# Patient Record
Sex: Female | Born: 1995 | Race: Black or African American | Hispanic: No | Marital: Single | Smoking: Never smoker
Health system: Southern US, Community
[De-identification: ages and names within clinical notes are randomized; demographics above are authoritative.]

## PROBLEM LIST (undated history)

## (undated) ENCOUNTER — Ambulatory Visit (HOSPITAL_COMMUNITY): Admission: EM | Payer: Medicaid Other

## (undated) ENCOUNTER — Emergency Department (HOSPITAL_COMMUNITY): Admission: EM | Payer: Medicaid Other | Source: Home / Self Care

## (undated) DIAGNOSIS — J45909 Unspecified asthma, uncomplicated: Secondary | ICD-10-CM

## (undated) DIAGNOSIS — F32A Depression, unspecified: Secondary | ICD-10-CM

## (undated) DIAGNOSIS — F419 Anxiety disorder, unspecified: Secondary | ICD-10-CM

## (undated) HISTORY — PX: NO PAST SURGERIES: SHX2092

---
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
## 2017-01-10 ENCOUNTER — Emergency Department (HOSPITAL_COMMUNITY)
Admission: EM | Admit: 2017-01-10 | Discharge: 2017-01-10 | Disposition: A | Discharge status: Home / Self Care | Payer: Medicaid Other | Attending: Emergency Medicine | Admitting: Emergency Medicine

## 2017-01-10 ENCOUNTER — Encounter (HOSPITAL_COMMUNITY): Payer: Self-pay | Admitting: *Deleted

## 2017-01-10 DIAGNOSIS — Z7722 Contact with and (suspected) exposure to environmental tobacco smoke (acute) (chronic): Secondary | ICD-10-CM | POA: Insufficient documentation

## 2017-01-10 DIAGNOSIS — Z202 Contact with and (suspected) exposure to infections with a predominantly sexual mode of transmission: Secondary | ICD-10-CM

## 2017-01-10 DIAGNOSIS — N898 Other specified noninflammatory disorders of vagina: Secondary | ICD-10-CM | POA: Diagnosis present

## 2017-01-10 DIAGNOSIS — N760 Acute vaginitis: Secondary | ICD-10-CM | POA: Insufficient documentation

## 2017-01-10 DIAGNOSIS — B9689 Other specified bacterial agents as the cause of diseases classified elsewhere: Secondary | ICD-10-CM | POA: Insufficient documentation

## 2017-01-10 DIAGNOSIS — J45909 Unspecified asthma, uncomplicated: Secondary | ICD-10-CM | POA: Diagnosis not present

## 2017-01-10 LAB — WET PREP, GENITAL
Sperm: NONE SEEN
Trich, Wet Prep: NONE SEEN
Yeast Wet Prep HPF POC: NONE SEEN

## 2017-01-10 MED ORDER — METRONIDAZOLE 500 MG PO TABS: 500.0000 mg | ORAL_TABLET | Freq: Two times a day (BID) | ORAL | 0 refills | Status: DC

## 2017-01-10 MED ORDER — AZITHROMYCIN 250 MG PO TABS
1000.0000 mg | ORAL_TABLET | Freq: Once | ORAL | Status: AC
  Administered 2017-01-10: 1000 mg via ORAL
  Filled 2017-01-10: qty 4

## 2017-01-10 NOTE — ED Provider Notes (Signed)
[facility]-EMERGENCY DEPT Provider Note   CSN: [id number] Arrival date & time: 01/10/17  1439     History   Chief Complaint Chief Complaint  Patient presents with  . Vaginal Discharge  . SEXUALLY TRANSMITTED DISEASE    HPI ARMYA WESTERHOFF is a 21 y.o. female.  Pt w ~2wk hx of malodorous vaginal discharge that has improved w OTC tx of "refresh." Pt reports pelvic discomfort w low back pain. Reports she has been sexually active w 1 partner, without protection and believes she has an STD. Denies dysuria, F/C, rashes or lesions, vaginal bleeding. LMP about 2.5wks ago. Pt reports hx chlamydia and BV. Nexplanon OCP.       Past Medical History:  Diagnosis Date  . Asthma     There are no active problems to display for this patient.   History reviewed. No pertinent surgical history.  OB History    No data available       Home Medications    Prior to Admission medications   Medication Sig Start Date End Date Taking? Authorizing Provider  albuterol (PROVENTIL HFA;VENTOLIN HFA) 108 (90 BASE) MCG/ACT inhaler Inhale 2 puffs into the lungs every 6 (six) hours as needed for wheezing or shortness of breath. 11/18/14  Yes Misty Bong, MD  etonogestrel (NEXPLANON) 68 MG IMPL implant 1 each by Subdermal route once. Implanted in left arm July 2015   Yes Historical Provider, MD  naproxen sodium (ALEVE) 220 MG tablet Take 440 mg by mouth daily as needed (pain/headaches/cramps).   Yes Historical Provider, MD  metroNIDAZOLE (FLAGYL) 500 MG tablet Take 1 tablet (500 mg total) by mouth 2 (two) times daily. 01/10/17   Misty N Russo, PA-C    Family History History reviewed. No pertinent family history.  Social History Social History  Substance Use Topics  . Smoking status: Passive Smoke Exposure - Never Smoker  . Smokeless tobacco: Not on file  . Alcohol use No     Allergies   Penicillins   Review of Systems Review of Systems  Constitutional: Negative for chills and fever.    Gastrointestinal: Negative for abdominal pain, nausea and vomiting.  Genitourinary: Positive for pelvic pain (b/l discomfort) and vaginal discharge. Negative for dyspareunia, dysuria, hematuria, vaginal bleeding and vaginal pain.  Musculoskeletal: Positive for back pain (low back).  Skin: Negative for rash (or lesions).     Physical Exam Updated Vital Signs BP 105/62 (BP Location: Right Arm)   Pulse 71   Temp 98.4 F (36.9 C) (Oral)   Resp 20   SpO2 100%   Physical Exam  Constitutional: She appears well-developed and well-nourished.  HENT:  Head: Normocephalic and atraumatic.  Eyes: Conjunctivae are normal.  Cardiovascular: Normal rate, regular rhythm, normal heart sounds and intact distal pulses.  Exam reveals no friction rub.   No murmur heard. Pulmonary/Chest: Effort normal and breath sounds normal. No respiratory distress. She has no wheezes. She has no rales.  Abdominal: Soft. Bowel sounds are normal. She exhibits no distension. There is no tenderness. There is no guarding.  Genitourinary: There is no tenderness or lesion on the right labia. There is no tenderness or lesion on the left labia. Uterus is not tender. Cervix exhibits discharge and friability. Cervix exhibits no motion tenderness. Right adnexum displays tenderness. Right adnexum displays no fullness. Left adnexum displays tenderness. Left adnexum displays no fullness. Vaginal discharge (copious yellow, frothy, malodorous discharge) found.  Genitourinary Comments: Pelvic exam performed w chaperone present  Skin: No rash noted.  Psychiatric: She has a normal mood and affect. Her behavior is normal.  Nursing note and vitals reviewed.    ED Treatments / Results  Labs (all labs ordered are listed, but only abnormal results are displayed) Labs Reviewed  WET PREP, GENITAL - Abnormal; Notable for the following:       Result Value   Clue Cells Wet Prep HPF POC PRESENT (*)    WBC, Wet Prep HPF POC MANY (*)    All  other components within normal limits  RPR  HIV ANTIBODY (ROUTINE TESTING)  GC/CHLAMYDIA PROBE AMP (Antoine) NOT AT [facility]    EKG  EKG Interpretation None       Radiology No results found.  Procedures Procedures (including critical care time)  Medications Ordered in ED Medications  azithromycin (ZITHROMAX) tablet 1,000 mg (1,000 mg Oral Given 01/10/17 1755)     Initial Impression / Assessment and Plan / ED Course  I have reviewed the triage vital signs and the nursing notes.  Pertinent labs & imaging results that were available during my care of the patient were reviewed by me and considered in my medical decision making (see chart for details).    Pt w BV and STD exposure. Patient to be discharged with instructions to follow up with OBGYN. Discussed importance of using protection when sexually active. Pt understands that they have GC/Chlamydia cultures pending and that they will need to inform all sexual partners if results return positive. Pt has been treated prophylactically with azithromycin due to pts history, pelvic exam, and wet prep with increased WBCs. Pt allergic to PCN so Rocephin not given. Explained need for additional abx if GC/Chlamydia is results positive. Pt not concerning for PID because hemodynamically stable and no cervical motion tenderness on pelvic exam. Pt has also been treated with flagyl for Bacterial Vaginosis. Pt has been advised to not drink alcohol while on this medication. Pt afebrile, not in distress, safe for discharge.   Discussed results, findings, treatment and follow up. Patient advised of return precautions. Patient verbalized understanding and agreed with plan.   Final Clinical Impressions(s) / ED Diagnoses   Final diagnoses:  BV (bacterial vaginosis)  Exposure to STD    New Prescriptions Discharge Medication List as of 01/10/2017  5:41 PM    START taking these medications   Details  metroNIDAZOLE (FLAGYL) 500 MG tablet Take 1  tablet (500 mg total) by mouth 2 (two) times daily., Starting Sat 01/10/2017, Print         Misty N Russo, PA-C 01/10/17 1858    Misty Barrette, MD 01/15/17 2201

## 2017-01-10 NOTE — ED Notes (Signed)
Pt testing on cell  Never looked up from the phone as I as ked her questions  Child approx 10 at the bedside

## 2017-01-10 NOTE — ED Triage Notes (Signed)
Pt reports having vaginal discharge and odor, thinks she has STD.

## 2017-01-10 NOTE — Discharge Instructions (Signed)
Please read instructions below. Talk with your primary care provider about any new medications. Please follow up with your primary care or OBGYN. Finish your antibiotic (Flagyl/metronidazole) as prescribed. You will receive a call from the hospital if your test results come back positive. Avoid sexual activity until you know your test results. If your results come back positive, it is important that you inform all of your sexual partners. Return to ER for fever or worsening symptoms.

## 2017-01-11 LAB — HIV ANTIBODY (ROUTINE TESTING W REFLEX): HIV Screen 4th Generation wRfx: NONREACTIVE

## 2017-01-11 LAB — RPR: RPR Ser Ql: NONREACTIVE

## 2017-01-12 LAB — GC/CHLAMYDIA PROBE AMP (~~LOC~~) NOT AT ARMC
Chlamydia: POSITIVE — AB
NEISSERIA GONORRHEA: NEGATIVE

## 2017-03-13 ENCOUNTER — Emergency Department
Admission: EM | Admit: 2017-03-13 | Discharge: 2017-03-13 | Disposition: A | Discharge status: Home / Self Care | Payer: No Typology Code available for payment source | Attending: Emergency Medicine | Admitting: Emergency Medicine

## 2017-03-13 ENCOUNTER — Emergency Department: Payer: No Typology Code available for payment source

## 2017-03-13 ENCOUNTER — Encounter: Payer: Self-pay | Admitting: Emergency Medicine

## 2017-03-13 DIAGNOSIS — Y92410 Unspecified street and highway as the place of occurrence of the external cause: Secondary | ICD-10-CM | POA: Insufficient documentation

## 2017-03-13 DIAGNOSIS — J45909 Unspecified asthma, uncomplicated: Secondary | ICD-10-CM | POA: Insufficient documentation

## 2017-03-13 DIAGNOSIS — S8991XA Unspecified injury of right lower leg, initial encounter: Secondary | ICD-10-CM | POA: Diagnosis present

## 2017-03-13 DIAGNOSIS — S50811A Abrasion of right forearm, initial encounter: Secondary | ICD-10-CM | POA: Insufficient documentation

## 2017-03-13 DIAGNOSIS — S90819A Abrasion, unspecified foot, initial encounter: Secondary | ICD-10-CM | POA: Diagnosis not present

## 2017-03-13 DIAGNOSIS — S8011XA Contusion of right lower leg, initial encounter: Secondary | ICD-10-CM | POA: Insufficient documentation

## 2017-03-13 DIAGNOSIS — Z79899 Other long term (current) drug therapy: Secondary | ICD-10-CM | POA: Insufficient documentation

## 2017-03-13 DIAGNOSIS — T07XXXA Unspecified multiple injuries, initial encounter: Secondary | ICD-10-CM

## 2017-03-13 DIAGNOSIS — S5011XA Contusion of right forearm, initial encounter: Secondary | ICD-10-CM | POA: Insufficient documentation

## 2017-03-13 DIAGNOSIS — Z7722 Contact with and (suspected) exposure to environmental tobacco smoke (acute) (chronic): Secondary | ICD-10-CM | POA: Insufficient documentation

## 2017-03-13 DIAGNOSIS — S5001XA Contusion of right elbow, initial encounter: Secondary | ICD-10-CM | POA: Insufficient documentation

## 2017-03-13 DIAGNOSIS — Y999 Unspecified external cause status: Secondary | ICD-10-CM | POA: Insufficient documentation

## 2017-03-13 DIAGNOSIS — Y939 Activity, unspecified: Secondary | ICD-10-CM | POA: Insufficient documentation

## 2017-03-13 MED ORDER — IBUPROFEN 600 MG PO TABS: 600.0000 mg | ORAL_TABLET | Freq: Three times a day (TID) | ORAL | 0 refills | Status: DC | PRN
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
## 2017-03-13 NOTE — Discharge Instructions (Signed)
Cleaning area with mild soap and water daily. Watch for any signs of infection. Take ibuprofen as needed for pain. You may also use ice packs to your elbow and leg as needed for pain or swelling. Follow-up with your primary care provider if any continued problems.
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
## 2017-07-06 ENCOUNTER — Ambulatory Visit: Payer: Self-pay | Admitting: Obstetrics & Gynecology

## 2017-07-17 ENCOUNTER — Ambulatory Visit (INDEPENDENT_AMBULATORY_CARE_PROVIDER_SITE_OTHER): Payer: Medicaid Other | Admitting: Obstetrics and Gynecology

## 2017-07-17 ENCOUNTER — Encounter: Payer: Self-pay | Admitting: Obstetrics and Gynecology

## 2017-07-17 DIAGNOSIS — Z3046 Encounter for surveillance of implantable subdermal contraceptive: Secondary | ICD-10-CM | POA: Insufficient documentation

## 2017-07-17 DIAGNOSIS — Z3049 Encounter for surveillance of other contraceptives: Secondary | ICD-10-CM

## 2017-07-17 NOTE — Patient Instructions (Signed)
Contraceptive Implant Information A contraceptive implant is a small, plastic rod that is inserted under the skin. The implant releases a hormone into the bloodstream that prevents pregnancy. Contraceptive implants can be effective for up to 3 years. They do not provide protection against STIs (sexually transmitted infections). How does the implant work? Contraceptive implants prevent pregnancy by releasing a small amount of progestin into the bloodstream. Progestin has similar effects to the hormone progesterone, which plays a role in menstrual periods and pregnancy. Progestin will:  Stop the ovaries from releasing eggs.  Thicken cervical mucus to prevent sperm from entering the cervix.  Thin out the lining of the uterus to prevent a fertilized egg from attaching to the wall of the uterus.  What are the advantages of this form of birth control? The advantages of this form of birth control include the following:  It is very effective at preventing pregnancy.  It is effective for up to 3 years.  It can easily be removed.  It does not interfere with sex or daily activities.  It can be used when breastfeeding.  It can be used by women who cannot take estrogen.  The procedure to insert the device is quick.  Women can get pregnant shortly after removing the device.  What are the disadvantages of this form of birth control? The disadvantages of this form of birth control include the following:  It can cause side effects, including: ? Irregular menstrual periods or bleeding. ? Headache. ? Weight gain. ? Acne. ? Breast tenderness. ? Abdomen (abdominal) pain. ? Mood changes, such as depression.  It does not protect against STIs.  You must make an office visit to have it inserted and removed by a trained clinician.  Inserting or removing the device can result in pain, scarring, and tissue or nerve damage (rare).  How is this implant inserted? The procedure to insert an implant  only takes a few minutes. During the procedure:  Your upper arm will be numbed with a numbing medicine (local anesthetic).  The implant will be injected under the skin of your upper arm with a needle.  After the procedure:  You may experience minor bruising, swelling, or discomfort at the insertion site. This should only last for a couple of days.  You may need to use another, non-hormonal contraceptive such as a condom for 7 days after the procedure.  How is the implant removed? The implant should be removed after 3 years or as directed by your health care provider. The procedure to remove the implant only takes a few minutes. During this procedure:  Your upper arm will be numbed with a local anesthetic.  A small incision will be made near the implant.  The implant will be removed with a small pair of forceps.  After the implant is removed:  The effect of the implant will wear off a few hours after removal. Most women will be able to get pregnant within 3 weeks of removal.  A new implant can be inserted as soon as the old one is removed, if desired.  You may experience minor bruising, swelling, or discomfort at the removal site. This should only last for a couple of days.  Is this implant right for me? Your health care provider can help you determine whether you are good candidate for a contraceptive implant. Make sure to discuss the possible side effects with your health care provider. You should not get the implant if you:  Are pregnant.  Are allergic   to any part of the implant.  Have a history of: ? Breast cancer. ? Unusual bleeding from the vagina. ? Heart disease. ? Stroke. ? Liver disease or tumors. ? Migraines.  Summary  A contraceptive implant is a small, plastic rod that is inserted under the skin. The implant releases a hormone into the bloodstream that prevents pregnancy.  Contraceptive implants can be effective for up to 3 years.  The implant works by  preventing ovaries from releasing eggs, thickening the cervical mucus, and thinning the uterine wall.  This form of birth control is very effective at preventing pregnancy and can be inserted and removed quickly. Women can get pregnant shortly after the device is removed.  This form of birth control can cause some side effects, including weight gain, breast tenderness, headaches, irregular periods or bleeding, acne, abdominal pain, and depression. It does not provide protection against STIs (sexually transmitted infections). This information is not intended to replace advice given to you by your health care provider. Make sure you discuss any questions you have with your health care provider. Document Released: 09/11/2011 Document Revised: 09/06/2016 Document Reviewed: 09/06/2016 Elsevier Interactive Patient Education  2017 Elsevier Inc.  

## 2017-07-17 NOTE — Progress Notes (Signed)
Misty Wood is a 21 y.o. who presents for removal of Implanon d/t to it being in place 3 years. She is planning to use condoms for contraception.    Nexplanon Removal Patient identified, informed consent performed, consent signed.   Appropriate time out taken. Nexplanon site identified.  Area prepped in usual sterile fashon. One ml of 1% lidocaine was used to anesthetize the area. Of note the implant had migrated from insertion site and was noted not to be superficial.  A small stab incision was made where the distal end of the implant was palpated. As noted above the implant was not superficial. Several attempts were made to grasp the distal end of the implant but were unsuccessful. Dr. Burnell Blanks was asked to assist. She also attempted to grasp the distal end of the implant but was unsuccessful. It was decided at that time to make a new stab incision which was felt to possible be closed to the implant. The area was prepped and anesthetized in the usual fashion. After several attempts the lower end of the implant was grasped and removed intact. There was minimal blood loss. There were no complications.   Steri-strips were applied over the small incision sites..  A pressure bandage was applied to reduce any bruising.  The patient tolerated the procedure well and was given post procedure instructions.  Patient is planning to use condoms  for contraception/attempt conception.   Hermina Staggers, MD, FACOG Attending Obstetrician & Gynecologist Center for [facility], [facility]
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
## 2018-02-24 ENCOUNTER — Encounter (HOSPITAL_COMMUNITY): Payer: Self-pay | Admitting: Family Medicine

## 2018-02-24 ENCOUNTER — Ambulatory Visit (HOSPITAL_COMMUNITY)
Admission: EM | Admit: 2018-02-24 | Discharge: 2018-02-24 | Disposition: A | Discharge status: Home / Self Care | Payer: Medicaid Other | Attending: Family Medicine | Admitting: Family Medicine

## 2018-02-24 DIAGNOSIS — J4551 Severe persistent asthma with (acute) exacerbation: Secondary | ICD-10-CM | POA: Diagnosis not present

## 2018-02-24 MED ORDER — IPRATROPIUM-ALBUTEROL 0.5-2.5 (3) MG/3ML IN SOLN
RESPIRATORY_TRACT | Status: AC
  Filled 2018-02-24: qty 3

## 2018-02-24 MED ORDER — IPRATROPIUM-ALBUTEROL 0.5-2.5 (3) MG/3ML IN SOLN
3.0000 mL | Freq: Once | RESPIRATORY_TRACT | Status: AC
  Administered 2018-02-24: 3 mL via RESPIRATORY_TRACT

## 2018-02-24 MED ORDER — PREDNISONE 10 MG (21) PO TBPK: ORAL_TABLET | Freq: Every day | ORAL | 0 refills | Status: DC

## 2018-02-24 MED ORDER — METHYLPREDNISOLONE SODIUM SUCC 125 MG IJ SOLR
125.0000 mg | Freq: Once | INTRAMUSCULAR | Status: AC
  Administered 2018-02-24: 125 mg via INTRAVENOUS

## 2018-02-24 MED ORDER — METHYLPREDNISOLONE SODIUM SUCC 125 MG IJ SOLR
INTRAMUSCULAR | Status: AC
  Filled 2018-02-24: qty 2

## 2018-02-24 NOTE — ED Triage Notes (Signed)
Pt with hx of asthma here for wheezing, coughing and SOB. She took a breathing treatment at 6 am and 12 pm. She also used her rescue inhaler without relief. She reports when she coughs that she cant breath. Pt in acute distress upon triage crying.

## 2018-02-24 NOTE — ED Provider Notes (Signed)
[facility]   [id number] 02/24/18 Arrival Time: 1439  ASSESSMENT & PLAN:  1. Severe persistent asthma with exacerbation    Nebulizer treatment needed: yes; reports significant improvement after single DuoNeb. Able to speak full sentences without problem.  Meds ordered this encounter  Medications  . ipratropium-albuterol (DUONEB) 0.5-2.5 (3) MG/3ML nebulizer solution 3 mL  . methylPREDNISolone sodium succinate (SOLU-MEDROL) 125 mg/2 mL injection 125 mg  . predniSONE (STERAPRED UNI-PAK 21 TAB) 10 MG (21) TBPK tablet    Sig: Take by mouth daily. Take as directed.    Dispense:  21 tablet    Refill:  0   Asthma precautions given. OTC symptom care as needed. May f/u with PCP or here as needed. ED if acute worsening.  Reviewed expectations re: course of current medical issues. Questions answered. Outlined signs and symptoms indicating need for more acute intervention. Patient verbalized understanding. After Visit Summary given.  SUBJECTIVE: History from: patient.  Misty Wood is a 22 y.o. female who presents with complaint of persistent chest tightness, non-productive cough and wheezing. Triggers: unknown. Onset abrupt, approximately several hours ago. Describes wheezing as moderate when present. Fever: no. Overall normal PO intake without n/v. Sick contacts: no. Typically her asthma is well controlled. Inhaler use: multiple times today in addition to home nebs; only very temporary and mild relief. OTC treatment: none. Reports feeling very SOB while in triage. No specific aggravating or alleviating factors reported.  Social History   Tobacco Use  Smoking Status Passive Smoke Exposure - Never Smoker  Smokeless Tobacco Never Used    ROS: As per HPI. All other systems negative.    OBJECTIVE:  Vitals:   02/24/18 1500  BP: 125/78  Pulse: (!) 107  Resp: (!) 28  Temp: 98.2 F (36.8 C)  SpO2: 97%    Tachycardia and tachypnea noted.  General appearance:  alert; appears fatigued HEENT: nasal congestion; clear runny nose; throat irritation secondary to post-nasal drainage Neck: supple without LAD CV: regular Lungs: unlabored respirations, moderate bilateral wheezing; cough: mild; moderate respiratory distress Abd: benign Ext: no edema Skin: warm and dry Psychological: alert and cooperative; normal mood and affect but tearful upon arrival   Allergies  Allergen Reactions  . Penicillins Hives    Has patient had a PCN reaction causing immediate rash, facial/tongue/throat swelling, SOB or lightheadedness with hypotension: Yes Has patient had a PCN reaction causing severe rash involving mucus membranes or skin necrosis: No Has patient had a PCN reaction that required hospitalization No Has patient had a PCN reaction occurring within the last 10 years: No If all of the above answers are "NO", then may proceed with Cephalosporin use.    Past Medical History:  Diagnosis Date  . Asthma    FH: Asthma.  Social History   Socioeconomic History  . Marital status: Single    Spouse name: Not on file  . Number of children: Not on file  . Years of education: Not on file  . Highest education level: Not on file  Occupational History  . Not on file  Social Needs  . Financial resource strain: Not on file  . Food insecurity:    Worry: Not on file    Inability: Not on file  . Transportation needs:    Medical: Not on file    Non-medical: Not on file  Tobacco Use  . Smoking status: Passive Smoke Exposure - Never Smoker  . Smokeless tobacco: Never Used  Substance and Sexual Activity  . Alcohol use:  No  . Drug use: No  . Sexual activity: Yes    Birth control/protection: Implant  Lifestyle  . Physical activity:    Days per week: Not on file    Minutes per session: Not on file  . Stress: Not on file  Relationships  . Social connections:    Talks on phone: Not on file    Gets together: Not on file    Attends religious service: Not on file     Active member of club or organization: Not on file    Attends meetings of clubs or organizations: Not on file    Relationship status: Not on file  . Intimate partner violence:    Fear of current or ex partner: Not on file    Emotionally abused: Not on file    Physically abused: Not on file    Forced sexual activity: Not on file  Other Topics Concern  . Not on file  Social History Narrative  . Not on file            Mardella Layman, MD 03/03/18 [phone]

## 2018-02-24 NOTE — Discharge Instructions (Signed)
You should return to the hospital if you develop worsening shortness of breath/difficulty breathing despite treatment with your prescribed medications, fevers for greater than 2 days, worsening cough, or for any other concerns related to your breathing problems. ° °Please proceed directly to the Emergency Department if: °You are short of breath even when at rest or when doing very little physical activity. °You develop difficulty eating, drinking, or talking due to asthma symptoms. °

## 2018-03-22 ENCOUNTER — Encounter (HOSPITAL_COMMUNITY): Payer: Self-pay | Admitting: Family Medicine

## 2018-03-22 ENCOUNTER — Emergency Department (HOSPITAL_COMMUNITY)
Admission: EM | Admit: 2018-03-22 | Discharge: 2018-03-22 | Disposition: A | Discharge status: Home / Self Care | Payer: Medicaid Other | Attending: Emergency Medicine | Admitting: Emergency Medicine

## 2018-03-22 DIAGNOSIS — Z5321 Procedure and treatment not carried out due to patient leaving prior to being seen by health care provider: Secondary | ICD-10-CM | POA: Insufficient documentation

## 2018-03-22 DIAGNOSIS — M542 Cervicalgia: Secondary | ICD-10-CM | POA: Diagnosis present

## 2018-03-22 DIAGNOSIS — Y998 Other external cause status: Secondary | ICD-10-CM | POA: Diagnosis not present

## 2018-03-22 DIAGNOSIS — Y92410 Unspecified street and highway as the place of occurrence of the external cause: Secondary | ICD-10-CM | POA: Diagnosis not present

## 2018-03-22 DIAGNOSIS — M545 Low back pain: Secondary | ICD-10-CM | POA: Insufficient documentation

## 2018-03-22 DIAGNOSIS — Y939 Activity, unspecified: Secondary | ICD-10-CM | POA: Diagnosis not present

## 2018-03-22 NOTE — ED Notes (Signed)
Pt asking how much longer before seen by provider. informed pt that she should be the next one to seen by PA.

## 2018-03-22 NOTE — ED Notes (Signed)
Patient and family member seen leaving ED.
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
## 2018-07-15 ENCOUNTER — Emergency Department (HOSPITAL_COMMUNITY)
Admission: EM | Admit: 2018-07-15 | Discharge: 2018-07-15 | Disposition: A | Discharge status: Other Acute Inpatient Hospital | Payer: Medicaid Other | Attending: Emergency Medicine | Admitting: Emergency Medicine

## 2018-07-15 ENCOUNTER — Encounter (HOSPITAL_COMMUNITY): Payer: Self-pay

## 2018-07-15 ENCOUNTER — Encounter (HOSPITAL_COMMUNITY): Payer: Self-pay | Admitting: *Deleted

## 2018-07-15 ENCOUNTER — Other Ambulatory Visit: Payer: Self-pay

## 2018-07-15 ENCOUNTER — Inpatient Hospital Stay (HOSPITAL_COMMUNITY)
Admission: AD | Admit: 2018-07-15 | Discharge: 2018-07-20 | DRG: 885 | Disposition: A | Discharge status: Home / Self Care | Payer: Medicaid Other | Source: Intra-hospital | Attending: Psychiatry | Admitting: Psychiatry

## 2018-07-15 DIAGNOSIS — F1210 Cannabis abuse, uncomplicated: Secondary | ICD-10-CM | POA: Diagnosis present

## 2018-07-15 DIAGNOSIS — F332 Major depressive disorder, recurrent severe without psychotic features: Secondary | ICD-10-CM | POA: Insufficient documentation

## 2018-07-15 DIAGNOSIS — R45851 Suicidal ideations: Secondary | ICD-10-CM | POA: Diagnosis present

## 2018-07-15 DIAGNOSIS — D649 Anemia, unspecified: Secondary | ICD-10-CM | POA: Diagnosis present

## 2018-07-15 DIAGNOSIS — J45909 Unspecified asthma, uncomplicated: Secondary | ICD-10-CM | POA: Diagnosis present

## 2018-07-15 DIAGNOSIS — Z7722 Contact with and (suspected) exposure to environmental tobacco smoke (acute) (chronic): Secondary | ICD-10-CM | POA: Insufficient documentation

## 2018-07-15 DIAGNOSIS — F411 Generalized anxiety disorder: Secondary | ICD-10-CM | POA: Diagnosis present

## 2018-07-15 DIAGNOSIS — Z62811 Personal history of psychological abuse in childhood: Secondary | ICD-10-CM | POA: Diagnosis not present

## 2018-07-15 DIAGNOSIS — R300 Dysuria: Secondary | ICD-10-CM | POA: Diagnosis not present

## 2018-07-15 DIAGNOSIS — Z79899 Other long term (current) drug therapy: Secondary | ICD-10-CM | POA: Diagnosis not present

## 2018-07-15 DIAGNOSIS — F4310 Post-traumatic stress disorder, unspecified: Secondary | ICD-10-CM | POA: Diagnosis present

## 2018-07-15 DIAGNOSIS — G4700 Insomnia, unspecified: Secondary | ICD-10-CM | POA: Diagnosis present

## 2018-07-15 DIAGNOSIS — Z91410 Personal history of adult physical and sexual abuse: Secondary | ICD-10-CM | POA: Insufficient documentation

## 2018-07-15 DIAGNOSIS — Z62810 Personal history of physical and sexual abuse in childhood: Secondary | ICD-10-CM | POA: Diagnosis not present

## 2018-07-15 DIAGNOSIS — F1990 Other psychoactive substance use, unspecified, uncomplicated: Secondary | ICD-10-CM | POA: Diagnosis not present

## 2018-07-15 DIAGNOSIS — Z733 Stress, not elsewhere classified: Secondary | ICD-10-CM | POA: Insufficient documentation

## 2018-07-15 DIAGNOSIS — F1410 Cocaine abuse, uncomplicated: Secondary | ICD-10-CM | POA: Diagnosis present

## 2018-07-15 LAB — POC URINE PREG, ED: PREG TEST UR: NEGATIVE

## 2018-07-15 LAB — CBC
HCT: 35 % — ABNORMAL LOW (ref 36.0–46.0)
Hemoglobin: 11.3 g/dL — ABNORMAL LOW (ref 12.0–15.0)
MCH: 30.1 pg (ref 26.0–34.0)
MCHC: 32.3 g/dL (ref 30.0–36.0)
MCV: 93.1 fL (ref 80.0–100.0)
PLATELETS: 184 10*3/uL (ref 150–400)
RBC: 3.76 MIL/uL — ABNORMAL LOW (ref 3.87–5.11)
RDW: 13.8 % (ref 11.5–15.5)
WBC: 5.6 10*3/uL (ref 4.0–10.5)
nRBC: 0 % (ref 0.0–0.2)

## 2018-07-15 LAB — COMPREHENSIVE METABOLIC PANEL
ALBUMIN: 4.2 g/dL (ref 3.5–5.0)
ALT: 16 U/L (ref 0–44)
ANION GAP: 9 (ref 5–15)
AST: 16 U/L (ref 15–41)
Alkaline Phosphatase: 36 U/L — ABNORMAL LOW (ref 38–126)
BUN: 10 mg/dL (ref 6–20)
CHLORIDE: 108 mmol/L (ref 98–111)
CO2: 24 mmol/L (ref 22–32)
Calcium: 9.2 mg/dL (ref 8.9–10.3)
Creatinine, Ser: 0.66 mg/dL (ref 0.44–1.00)
GFR calc Af Amer: >60 mL/min (ref >60)
GFR calc non Af Amer: >60 mL/min (ref >60)
GLUCOSE: 89 mg/dL (ref 70–99)
POTASSIUM: 3.5 mmol/L (ref 3.5–5.1)
SODIUM: 141 mmol/L (ref 135–145)
Total Bilirubin: 0.6 mg/dL (ref 0.3–1.2)
Total Protein: 7.5 g/dL (ref 6.5–8.1)

## 2018-07-15 LAB — RAPID URINE DRUG SCREEN, HOSP PERFORMED
Amphetamines: NOT DETECTED
Barbiturates: NOT DETECTED
Benzodiazepines: NOT DETECTED
Cocaine: POSITIVE — AB
OPIATES: NOT DETECTED
Tetrahydrocannabinol: POSITIVE — AB

## 2018-07-15 LAB — ACETAMINOPHEN LEVEL

## 2018-07-15 LAB — SALICYLATE LEVEL: Salicylate Lvl: <7 mg/dL (ref 2.8–30.0)

## 2018-07-15 LAB — ETHANOL: Alcohol, Ethyl (B): <10 mg/dL (ref <10)

## 2018-07-15 MED ORDER — ALUM & MAG HYDROXIDE-SIMETH 200-200-20 MG/5ML PO SUSP: 30.0000 mL | ORAL | Status: DC | PRN

## 2018-07-15 MED ORDER — ACETAMINOPHEN 325 MG PO TABS
650.0000 mg | ORAL_TABLET | Freq: Four times a day (QID) | ORAL | Status: DC | PRN
  Administered 2018-07-16 – 2018-07-19 (×4): 650 mg via ORAL
  Filled 2018-07-15 (×4): qty 2

## 2018-07-15 MED ORDER — MAGNESIUM HYDROXIDE 400 MG/5ML PO SUSP
30.0000 mL | Freq: Every day | ORAL | Status: DC | PRN
  Administered 2018-07-16 – 2018-07-19 (×2): 30 mL via ORAL
  Filled 2018-07-15 (×2): qty 30

## 2018-07-15 MED ORDER — TRAZODONE HCL 50 MG PO TABS
50.0000 mg | ORAL_TABLET | Freq: Every evening | ORAL | Status: DC | PRN
  Administered 2018-07-18 – 2018-07-19 (×2): 50 mg via ORAL
  Filled 2018-07-15 (×2): qty 1

## 2018-07-15 MED ORDER — HYDROXYZINE HCL 25 MG PO TABS
25.0000 mg | ORAL_TABLET | Freq: Three times a day (TID) | ORAL | Status: DC | PRN
  Administered 2018-07-17 – 2018-07-20 (×5): 25 mg via ORAL
  Filled 2018-07-15 (×6): qty 1
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
## 2018-07-15 NOTE — ED Notes (Signed)
Pt signed release of information for her mom, Galen Manila, and her significant other, Wynne Dust.

## 2018-07-15 NOTE — BH Assessment (Addendum)
[facility] Assessment Progress Note  Pt has been accepted to [facility] 406-1, to arrive at 2100. Pt is voluntary and support paperwork has been signed and faxed to [phone]. Original copies are in record. Call report to [phone]. Pt's RN, Clydie Braun, notified.   Johny Shock. Ladona Ridgel, MS, NCC, LPCA Counselor
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
## 2018-07-15 NOTE — ED Notes (Signed)
Report called to RN Armando Reichert, [facility].  Pending [facility] transport in 1 hour.

## 2018-07-15 NOTE — ED Provider Notes (Signed)
[facility]-EMERGENCY DEPT Provider Note   CSN: [id number] Arrival date & time: 07/15/18  1415     History   Chief Complaint Chief Complaint  Patient presents with  . Suicidal    HPI Misty Wood is a 22 y.o. female.  22 y.o female with a PMH of Asthma presents to the ED with a chief complaint of SI.  Reports things started getting bad 2 months ago when she had a big argument with her mom were the words were said.  Mother kicked the patient out of her home and patient had to moving with cousin.  Is been living with her cousin but reports cousin recently told her that she needs to move out, she also reports she was supposed to get paid today and did not and states "I cannot do this myself and I have been fighting the suicidal thoughts for a while now".  Patient does report some chest tightness which begins on and off when she is upset, she denies any previous history of anxiety. She reports homicidal ideations stating she will "drink a bunch of benadryl", has no previous SI. She denies any shortness of breath, abdominal pain, urinary symptoms, gynecological complaints or HI, hallucinations.      Past Medical History:  Diagnosis Date  . Asthma     Patient Active Problem List   Diagnosis Date Noted  . Implanon removal 07/17/2017    No past surgical history on file.   OB History   None      Home Medications    Prior to Admission medications   Medication Sig Start Date End Date Taking? Authorizing Provider  albuterol (PROVENTIL HFA;VENTOLIN HFA) 108 (90 BASE) MCG/ACT inhaler Inhale 2 puffs into the lungs every 6 (six) hours as needed for wheezing or shortness of breath. 11/18/14   Rodolph Bong, MD  ibuprofen (ADVIL,MOTRIN) 600 MG tablet Take 1 tablet (600 mg total) by mouth every 8 (eight) hours as needed. Patient not taking: Reported on 07/17/2017 03/13/17   Bridget Hartshorn L, PA-C  naproxen sodium (ALEVE) 220 MG tablet Take 440 mg by mouth daily  as needed (pain/headaches/cramps).    [provider]  predniSONE (STERAPRED UNI-PAK 21 TAB) 10 MG (21) TBPK tablet Take by mouth daily. Take as directed. 02/24/18   Mardella Layman, MD    Family History No family history on file.  Social History Social History   Tobacco Use  . Smoking status: Passive Smoke Exposure - Never Smoker  . Smokeless tobacco: Never Used  Substance Use Topics  . Alcohol use: No  . Drug use: Yes    Types: Marijuana    Comment: Last used: This AM      Allergies   Penicillins   Review of Systems Review of Systems  Constitutional: Negative for chills and fever.  HENT: Negative for rhinorrhea and sore throat.   Respiratory: Positive for chest tightness. Negative for shortness of breath.   Cardiovascular: Negative for chest pain and palpitations.  Gastrointestinal: Negative for constipation, diarrhea, nausea and vomiting.  Genitourinary: Negative for dysuria and flank pain.  Musculoskeletal: Negative for back pain.  Skin: Negative for pallor and wound.  Neurological: Negative for light-headedness and headaches.  Psychiatric/Behavioral: Positive for suicidal ideas. Negative for confusion, hallucinations and self-injury.  All other systems reviewed and are negative.    Physical Exam Updated Vital Signs Ht 5\' 2"  (1.575 m)   Wt 77.1 kg   LMP 07/08/2018 (Approximate)   BMI 31.09 kg/m  Physical Exam  Constitutional: She is oriented to person, place, and time. She appears well-developed and well-nourished.  HENT:  Head: Normocephalic and atraumatic.  Neck: Normal range of motion. Neck supple.  Cardiovascular: Normal heart sounds.  Pulmonary/Chest: Effort normal and breath sounds normal. She has no wheezes.  Abdominal: Soft.  Musculoskeletal: She exhibits no tenderness.  Neurological: She is alert and oriented to person, place, and time.  Skin: Skin is warm and dry.  Nursing note and vitals reviewed.    ED Treatments / Results   Labs (all labs ordered are listed, but only abnormal results are displayed) Labs Reviewed  COMPREHENSIVE METABOLIC PANEL - Abnormal; Notable for the following components:      Result Value   Alkaline Phosphatase 36 (*)    All other components within normal limits  ACETAMINOPHEN LEVEL - Abnormal; Notable for the following components:   Acetaminophen (Tylenol), Serum <10 (*)    All other components within normal limits  CBC - Abnormal; Notable for the following components:   RBC 3.76 (*)    Hemoglobin 11.3 (*)    HCT 35.0 (*)    All other components within normal limits  RAPID URINE DRUG SCREEN, HOSP PERFORMED - Abnormal; Notable for the following components:   Cocaine POSITIVE (*)    Tetrahydrocannabinol POSITIVE (*)    All other components within normal limits  ETHANOL  SALICYLATE LEVEL  POC URINE PREG, ED  I-STAT BETA HCG BLOOD, ED ([facility], [facility], [facility] ONLY)  POC URINE PREG, ED    EKG None  Radiology No results found.  Procedures Procedures (including critical care time)  Medications Ordered in ED Medications - No data to display   Initial Impression / Assessment and Plan / ED Course  I have reviewed the triage vital signs and the nursing notes.  Pertinent labs & imaging results that were available during my care of the patient were reviewed by me and considered in my medical decision making (see chart for details).   Patient presents with suicidal ideations she had a very hard previous 2 months, she was kicked out of her house by her mom and now reports she living with her cousin who recently asked her to move her out.  She reports it was paid a today but she could not fight off the suicidal ideations anymore.  She does report some chest pain chest tightness which comes on when she is feeling agitated, denies any previous history of anxiety.  Will order medical screening prior to TTS consult. UDS showed cocaine, THC positive. CMP showed no electrolyte abnormality.  CBC showed  no leukocytosis, hemoglobin is 11.3 I have personally reviewed patient's chart unable to find prior visit to compare this too. She denies any lightheadedness, dizziness.  An EKG was order to r/o any infarct or ischemia as she does report chest tightness. Ekg showed no changes consistent with infarct or STEMI. Patient is medically cleared for TTS consult.   Final Clinical Impressions(s) / ED Diagnoses   Final diagnoses:  Suicidal ideation    ED Discharge Orders    None       Claude Manges, PA-C 07/15/18 1614    Tegeler, Canary Brim, MD 07/15/18 1655

## 2018-07-15 NOTE — ED Notes (Addendum)
Pt resting at present, no distress noted, calm & cooperative.  A&O x 3, monitoring for safety, Q 15 min checks in effect.  Pending report to [facility] and [facility] transfer after 9pm.

## 2018-07-16 ENCOUNTER — Other Ambulatory Visit: Payer: Self-pay

## 2018-07-16 DIAGNOSIS — Z62810 Personal history of physical and sexual abuse in childhood: Secondary | ICD-10-CM

## 2018-07-16 DIAGNOSIS — Z62811 Personal history of psychological abuse in childhood: Secondary | ICD-10-CM

## 2018-07-16 DIAGNOSIS — R45851 Suicidal ideations: Secondary | ICD-10-CM

## 2018-07-16 DIAGNOSIS — F332 Major depressive disorder, recurrent severe without psychotic features: Principal | ICD-10-CM

## 2018-07-16 LAB — LIPID PANEL
CHOLESTEROL: 192 mg/dL (ref 0–200)
HDL: 47 mg/dL (ref >40)
LDL Cholesterol: 135 mg/dL — ABNORMAL HIGH (ref 0–99)
Total CHOL/HDL Ratio: 4.1 RATIO
Triglycerides: 52 mg/dL (ref <150)
VLDL: 10 mg/dL (ref 0–40)

## 2018-07-16 LAB — HEMOGLOBIN A1C
Hgb A1c MFr Bld: 5.4 % (ref 4.8–5.6)
MEAN PLASMA GLUCOSE: 108.28 mg/dL

## 2018-07-16 LAB — TSH: TSH: 1.531 u[IU]/mL (ref 0.350–4.500)

## 2018-07-16 MED ORDER — ENSURE ENLIVE PO LIQD
237.0000 mL | Freq: Two times a day (BID) | ORAL | Status: DC
  Administered 2018-07-16 – 2018-07-20 (×4): 237 mL via ORAL

## 2018-07-16 MED ORDER — CITALOPRAM HYDROBROMIDE 10 MG PO TABS
10.0000 mg | ORAL_TABLET | Freq: Every day | ORAL | Status: DC
  Administered 2018-07-16 – 2018-07-17 (×2): 10 mg via ORAL
  Filled 2018-07-16 (×5): qty 1

## 2018-07-16 MED ORDER — ALBUTEROL SULFATE HFA 108 (90 BASE) MCG/ACT IN AERS
2.0000 | INHALATION_SPRAY | Freq: Four times a day (QID) | RESPIRATORY_TRACT | Status: DC | PRN
  Filled 2018-07-16: qty 6.7

## 2018-07-16 NOTE — Progress Notes (Signed)
D: Patient has been tearful today.  She had an incident where she threw a trash can in her room.  She states, "I talked to my mother and she upset me."  Patient is worried about where she will go after discharge.  She states that her cousin took all her belongings to her mother's house.  She states, "I feel like I'm homeless.  I don't want to go back to my mother's house."  Explained to patient that if her mother was going to upset her that much, it would best if she didn't talk with her today.  She agreed, however, states that mother will probably visit her.  She denies any thoughts of self harm.  She denies any depressive systems.  A: Continue to monitor medication management and MD orders.  Safety checks completed every 15 minutes per protocol.  Offer support and encouragement as needed.  R: Patient is receptive to staff; her behavior is appropriate.

## 2018-07-16 NOTE — Tx Team (Signed)
Initial Treatment Plan 07/16/2018 1:07 AM [name] ZOX:[id number]    PATIENT STRESSORS: Marital or family conflict Substance abuse Traumatic event   PATIENT STRENGTHS: Average or above average intelligence Capable of independent living Financial means Motivation for treatment/growth Supportive family/friends Work skills   PATIENT IDENTIFIED PROBLEMS:   "I don't want to feel so hopeless."    Depression  At risk for suicide             DISCHARGE CRITERIA:  Improved stabilization in mood, thinking, and/or behavior Need for constant or close observation no longer present Reduction of life-threatening or endangering symptoms to within safe limits Verbal commitment to aftercare and medication compliance  PRELIMINARY DISCHARGE PLAN: Attend 12-step recovery group Outpatient therapy Return to previous living arrangement Return to previous work or school arrangements  PATIENT/FAMILY INVOLVEMENT: This treatment plan has been presented to and reviewed with the patient, Misty Wood, and/or family member.  The patient and family have been given the opportunity to ask questions and make suggestions.  Lawrence Marseilles, RN 07/16/2018, 1:07 AM

## 2018-07-16 NOTE — Progress Notes (Signed)
Nursing note 7p-7a  Pt observed interacting with peers on unit this shift. Displayed a positive/ bright  affect and pleasant mood upon interaction with this Clinical research associate. Pt complains of lower abd pain/ cramps, constipation and BV symptoms. See MAR for prn medication administration. Pt denies SI/HI, and also denies any audio or visual hallucinations at this time.  Pt is able to verbally contract for safety with this RN. Goal: "to be more open and verbal"  Pt is now resting in bed with eyes closed, with no signs or symptoms of pain or distress noted. Pt continues to remain safe on the unit and is observed by rounding every 15 min. RN will continue to monitor.
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
## 2018-07-16 NOTE — Plan of Care (Signed)
  Problem: Education: Goal: Knowledge of [facility] Education information/materials will improve Outcome: Progressing Goal: Emotional status will improve Outcome: Progressing Goal: Mental status will improve Outcome: Progressing Goal: Verbalization of understanding the information provided will improve Outcome: Progressing   Problem: Activity: Goal: Interest or engagement in activities will improve Outcome: Progressing Goal: Sleeping patterns will improve Outcome: Progressing   Problem: Coping: Goal: Ability to verbalize frustrations and anger appropriately will improve Outcome: Progressing Goal: Ability to demonstrate self-control will improve Outcome: Progressing   Problem: Health Behavior/Discharge Planning: Goal: Identification of resources available to assist in meeting health care needs will improve Outcome: Progressing Goal: Compliance with treatment plan for underlying cause of condition will improve Outcome: Progressing   Problem: Physical Regulation: Goal: Ability to maintain clinical measurements within normal limits will improve Outcome: Progressing   Problem: Safety: Goal: Periods of time without injury will increase Outcome: Progressing   Problem: Coping: Goal: Coping ability will improve Outcome: Progressing Goal: Will verbalize feelings Outcome: Progressing   Problem: Safety: Goal: Ability to identify and utilize support systems that promote safety will improve Outcome: Progressing   Problem: Self-Concept: Goal: Level of anxiety will decrease Outcome: Progressing   Problem: Health Behavior/Discharge Planning: Goal: Identification of resources available to assist in meeting health care needs will improve Outcome: Progressing   Problem: Medication: Goal: Compliance with prescribed medication regimen will improve Outcome: Progressing
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
## 2018-07-16 NOTE — Progress Notes (Signed)
Pt attended morning goals/edu group.  

## 2018-07-16 NOTE — BHH Group Notes (Signed)
LCSW Group Therapy Note 07/16/2018 2:28 PM  Type of Therapy/Topic: Group Therapy: Emotion Regulation  Participation Level: Active   Description of Group:  The purpose of this group is to assist patients in learning to regulate negative emotions and experience positive emotions. Patients will be guided to discuss ways in which they have been vulnerable to their negative emotions. These vulnerabilities will be juxtaposed with experiences of positive emotions or situations, and patients will be challenged to use positive emotions to combat negative ones. Special emphasis will be placed on coping with negative emotions in conflict situations, and patients will process healthy conflict resolution skills.  Therapeutic Goals: 1. Patient will identify two positive emotions or experiences to reflect on in order to balance out negative emotions 2. Patient will label two or more emotions that they find the most difficult to experience 3. Patient will demonstrate positive conflict resolution skills through discussion and/or role plays  Summary of Patient Progress:  Cherrill was engaged and participated throughout the group session. Tian reports that she struggles with managing her anger. Rasheeda states she typically lashes out on who is around her. Abiha states she would like to learn how to cope with her anger and other every day stressors.    Therapeutic Modalities:  Cognitive Behavioral Therapy Feelings Identification Dialectical Behavioral Therapy   Alcario Drought Clinical Social Worker

## 2018-07-16 NOTE — Progress Notes (Signed)
NUTRITION ASSESSMENT  Pt identified as at risk on the Malnutrition Screen Tool  INTERVENTION: 1. Supplements: Continue Ensure Enlive po BID, each supplement provides 350 kcal and 20 grams of protein  NUTRITION DIAGNOSIS: Unintentional weight loss related to sub-optimal intake as evidenced by pt report.   Goal: Pt to meet >/= 90% of their estimated nutrition needs.  Monitor:  PO intake  Assessment:  Pt admitted with depression. Pt has lost 31 lb since 6/17 (18% wt loss x 4 months, significant for time frame). Ensure supplements have been ordered, will continue.  Height: Ht Readings from Last 1 Encounters:  07/15/18 5\' 2"  (1.575 m)    Weight: Wt Readings from Last 1 Encounters:  07/15/18 63 kg    Weight Hx: Wt Readings from Last 10 Encounters:  07/15/18 63 kg  07/15/18 77.1 kg  03/22/18 77.1 kg  07/17/17 81.8 kg  03/13/17 76.7 kg (91 %, Z= 1.37)*   * Growth percentiles are based on CDC (Girls, 2-20 Years) data.    BMI:  Body mass index is 25.42 kg/m. Pt meets criteria for normal based on current BMI.  Estimated Nutritional Needs: Kcal: 25-30 kcal/kg Protein: > 1 gram protein/kg Fluid: 1 ml/kcal  Diet Order:  Diet Order            Diet regular Room service appropriate? Yes; Fluid consistency: Thin  Diet effective now             Pt is also offered choice of unit snacks mid-morning and mid-afternoon.  Pt is eating as desired.   Lab results and medications reviewed.   Tilda Franco, MS, RD, LDN [facility] Inpatient Clinical Dietitian Pager: [phone] After Hours Pager: [phone]

## 2018-07-16 NOTE — Progress Notes (Signed)
Recreation Therapy Notes  Date: 10.11.19 Time: 0930 Location: [address] Dayroom  Group Topic: Stress Management  Goal Area(s) Addresses:  Patient will verbalize importance of using healthy stress management.  Patient will identify positive emotions associated with healthy stress management.   Intervention: Stress Management  Activity :  Meditation.  LRT introduced patients to the stress management technique of meditation.  LRT played a meditation for patients to engage and follow along.  Education:  Stress Management, Discharge Planning.   Education Outcome: Acknowledges edcuation/In group clarification offered/Needs additional education  Clinical Observations/Feedback: Pt did not attend group.    Caroll Rancher, LRT/CTRS         Lillia Abed, Kelvis Berger A 07/16/2018 11:10 AM

## 2018-07-16 NOTE — Tx Team (Signed)
Interdisciplinary Treatment and Diagnostic Plan Update  07/16/2018 Time of Session: 9:30am Misty Wood MRN: [id number]  Principal Diagnosis: <principal problem not specified>  Secondary Diagnoses: Active Problems:   Severe recurrent major depression without psychotic features (HCC)   Current Medications:  Current Facility-Administered Medications  Medication Dose Route Frequency Provider Last Rate Last Dose  . acetaminophen (TYLENOL) tablet 650 mg  650 mg Oral Q6H PRN Misty Wood A, NP      . albuterol (PROVENTIL HFA;VENTOLIN HFA) 108 (90 Base) MCG/ACT inhaler 2 puff  2 puff Inhalation Q6H PRN Misty Covert, MD      . alum & mag hydroxide-simeth (MAALOX/MYLANTA) 200-200-20 MG/5ML suspension 30 mL  30 mL Oral Q4H PRN Misty Wood A, NP      . citalopram (CELEXA) tablet 10 mg  10 mg Oral Daily Misty Covert, MD   10 mg at 07/16/18 0818  . feeding supplement (ENSURE ENLIVE) (ENSURE ENLIVE) liquid 237 mL  237 mL Oral BID BM Misty Covert, MD   237 mL at 07/16/18 0818  . hydrOXYzine (ATARAX/VISTARIL) tablet 25 mg  25 mg Oral TID PRN Misty Wood A, NP      . magnesium hydroxide (MILK OF MAGNESIA) suspension 30 mL  30 mL Oral Daily PRN Misty Wood A, NP      . traZODone (DESYREL) tablet 50 mg  50 mg Oral QHS PRN Misty Nunnery, NP       PTA Medications: Medications Prior to Admission  Medication Sig Dispense Refill Last Dose  . acetaminophen (TYLENOL) 500 MG tablet Take 1,000 mg by mouth every 6 (six) hours as needed (cramping).   Past Week at Unknown time  . albuterol (PROVENTIL HFA;VENTOLIN HFA) 108 (90 BASE) MCG/ACT inhaler Inhale 2 puffs into the lungs every 6 (six) hours as needed for wheezing or shortness of breath. 1 Inhaler 2 Over a month ago  . ibuprofen (ADVIL,MOTRIN) 600 MG tablet Take 1 tablet (600 mg total) by mouth every 8 (eight) hours as needed. (Patient taking differently: Take 600 mg by mouth every 8 (eight) hours as needed for moderate pain. ) 30  tablet 0 Over a month ago  . naproxen sodium (ALEVE) 220 MG tablet Take 440 mg by mouth daily as needed (pain/headaches/cramps).     . predniSONE (STERAPRED UNI-PAK 21 TAB) 10 MG (21) TBPK tablet Take by mouth daily. Take as directed. 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   Physician Treatment Plan for Primary Diagnosis: <principal problem not specified> Long Term Goal(s): Improvement in symptoms so as ready for discharge Improvement in symptoms so as ready for discharge   Short Term Goals: Ability to identify changes in lifestyle to reduce recurrence of condition will improve Ability to verbalize feelings will improve Ability to disclose and discuss suicidal ideas Ability to demonstrate self-control will improve Ability to identify and develop effective coping behaviors will improve Ability to maintain clinical measurements within normal limits will improve Ability to identify triggers associated with substance abuse/mental health issues will improve Ability to identify changes in lifestyle to reduce recurrence of condition will improve Ability to verbalize feelings will improve Ability to disclose  and discuss suicidal ideas Ability to demonstrate self-control will improve Ability to identify and develop effective coping behaviors will improve Ability to maintain clinical measurements within normal limits will improve Ability to identify triggers associated with substance abuse/mental health issues will improve  Medication Management:  Evaluate patient's response, side effects, and tolerance of medication regimen.  Therapeutic Interventions: 1 to 1 sessions, Unit Group sessions and Medication administration.  Evaluation of Outcomes: Not Met  Physician Treatment Plan for Secondary Diagnosis: Active Problems:   Severe recurrent major depression without psychotic features ([place])  Long Term Goal(s): Improvement in symptoms so as ready for discharge Improvement in symptoms so as ready for discharge   Short Term Goals: Ability to identify changes in lifestyle to reduce recurrence of condition will improve Ability to verbalize feelings will improve Ability to disclose and discuss suicidal ideas Ability to demonstrate self-control will improve Ability to identify and develop effective coping behaviors will improve Ability to maintain clinical measurements within normal limits will improve Ability to identify triggers associated with substance abuse/mental health issues will improve Ability to identify changes in lifestyle to reduce recurrence of condition will improve Ability to verbalize feelings will improve Ability to disclose and discuss suicidal ideas Ability to demonstrate self-control will improve Ability to identify and develop effective coping behaviors will improve Ability to maintain clinical measurements within normal limits will improve Ability to identify triggers associated with substance abuse/mental health issues will improve     Medication Management: Evaluate patient's response, side effects, and tolerance of medication regimen.  Therapeutic Interventions: 1 to 1 sessions, Unit Group sessions and Medication administration.  Evaluation of Outcomes: Not Met   RN Treatment Plan for Primary Diagnosis: <principal problem not specified> Long Term Goal(s): Knowledge of disease and therapeutic regimen to maintain health will improve  Short Term Goals: Ability to participate in decision making will improve,  Ability to disclose and discuss suicidal ideas, Ability to identify and develop effective coping behaviors will improve and Compliance with prescribed medications will improve  Medication Management: RN will administer medications as ordered by provider, will assess and evaluate patient's response and provide education to patient for prescribed medication. RN will report any adverse and/or side effects to prescribing provider.  Therapeutic Interventions: 1 on 1 counseling sessions, Psychoeducation, Medication administration, Evaluate responses to treatment, Monitor vital signs and CBGs as ordered, Perform/monitor CIWA, COWS, AIMS and Fall Risk screenings as ordered, Perform wound care treatments as ordered.  Evaluation of Outcomes: Not Met   LCSW Treatment Plan for Primary Diagnosis: <principal problem not specified> Long Term Goal(s): Safe transition to appropriate next level of care at discharge, Engage patient in therapeutic group addressing interpersonal concerns.  Short Term Goals: Engage patient in aftercare planning with referrals and resources  Therapeutic Interventions: Assess for all discharge needs, 1 to 1 time with Social worker, Explore available resources and support systems, Assess for adequacy in community support network, Educate family and significant other(s) on suicide prevention, Complete Psychosocial Assessment, Interpersonal group therapy.  Evaluation of Outcomes: Not Met   Progress in Treatment: Attending groups: Yes. Participating in groups: Yes. Taking medication as prescribed: Yes. Toleration medication: Yes. Family/Significant other contact made: No, will contact:  the patient's significant other Patient understands diagnosis: Yes. Discussing patient identified problems/goals with staff: Yes. Medical problems stabilized or resolved: No. Denies suicidal/homicidal ideation: Yes. Issues/concerns per patient self-inventory: No. Other:   New problem(s)  identified: None   New Short Term/Long Term Goal(s): medication stabilization, elimination of SI thoughts, development of  comprehensive mental wellness plan.    Patient Goals:  "I want to feel more valuable and learn coping skills for everyday stressors"  Discharge Plan or Barriers: CSW will assess for appropriate referrals and discharge planning.   Reason for Continuation of Hospitalization: Depression Medication stabilization  Estimated Length of Stay: 3-5 days  Attendees: Patient: Misty Wood  07/16/2018 11:43 AM  Physician: Dr. Myles Lipps, MD 07/16/2018 11:43 AM  Nursing: Chrys Racer.Jacinto Reap, RN 07/16/2018 11:43 AM  RN Care Manager: Lars Pinks, RN 07/16/2018 11:43 AM  Social Worker: Radonna Ricker, [place] 07/16/2018 11:43 AM  Recreational Therapist: Rhunette Croft 07/16/2018 11:43 AM  Other: Marvia Pickles, NP 07/16/2018 11:43 AM  Other:  07/16/2018 11:43 AM  Other: 07/16/2018 11:43 AM    Scribe for Treatment Team: Marylee Floras, [place] 07/16/2018 11:43 AM
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
## 2018-07-17 LAB — URINALYSIS, COMPLETE (UACMP) WITH MICROSCOPIC
BACTERIA UA: NONE SEEN
Bilirubin Urine: NEGATIVE
Glucose, UA: NEGATIVE mg/dL
HGB URINE DIPSTICK: NEGATIVE
Ketones, ur: NEGATIVE mg/dL
Leukocytes, UA: NEGATIVE
NITRITE: NEGATIVE
Protein, ur: NEGATIVE mg/dL
SPECIFIC GRAVITY, URINE: 1.005 (ref 1.005–1.030)
pH: 7 (ref 5.0–8.0)

## 2018-07-17 MED ORDER — FLUCONAZOLE 100 MG PO TABS
150.0000 mg | ORAL_TABLET | Freq: Once | ORAL | Status: AC
  Administered 2018-07-17: 150 mg via ORAL
  Filled 2018-07-17: qty 1.5

## 2018-07-17 MED ORDER — CITALOPRAM HYDROBROMIDE 10 MG PO TABS
10.0000 mg | ORAL_TABLET | Freq: Once | ORAL | Status: AC
  Administered 2018-07-17: 10 mg via ORAL
  Filled 2018-07-17: qty 1

## 2018-07-17 MED ORDER — CITALOPRAM HYDROBROMIDE 20 MG PO TABS
20.0000 mg | ORAL_TABLET | Freq: Every day | ORAL | Status: DC
  Administered 2018-07-18 – 2018-07-20 (×3): 20 mg via ORAL
  Filled 2018-07-17 (×5): qty 1

## 2018-07-17 NOTE — Plan of Care (Signed)
D: Patient presents pleasant, bright. She reports feeling low energy and nausea and weakness d/t Celexa. She also reports feeling anxious and shaky. She complains of cramping in lower abdomen, and believes she may have a yeast infection. She slept fair last night, and did not request medication to help with sleep. Her appetite is fair, energy low and concentration good. She rates her depression, hopelessness and anxiety 0/10. Patient denies SI/HI/AVH.  A: Patient declines vistaril for anxiety. Patient checked q15 min, and checks reviewed. Reviewed medication changes with patient and educated on side effects. Educated patient on importance of attending group therapy sessions and educated on several coping skills. Encouarged participation in milieu through recreation therapy and attending meals with peers. Support and encouragement provided. Fluids offered. R: Patient receptive to education on medications, and is medication compliant. Patient contracts for safety on the unit. Goal: "To focus on my anger and learn how to express what I feel respectfully" and "talk about coping mechanisms, practice 'I feel statements'."

## 2018-07-17 NOTE — Progress Notes (Signed)
[facility] MD Progress Note  07/17/2018 12:02 PM XYLIA SCHERGER  MRN:  [id number] Subjective: Patient is seen and examined.  Patient is a 22 year old female with a past psychiatric history significant for major depression, posttraumatic stress disorder as well as generalized anxiety.  She seen in follow-up.  She is perhaps slightly better.  She got very upset on the telephone talking to her mother yesterday, and threw garbage can in the room.  This morning she was considering stopping her medication.  She did not think that she needed the medication.  We discussed that at length today.  We discussed increasing her Celexa to 20 mg p.o. daily.  She remains somewhat tearful and sad.  She is irritable at times.  We discussed that.  She reported some dysuria, and she is concerned for a fungal urinary tract infection.  I have gone on and send off a urinalysis, but I went on and gave her 150 mg of Diflucan x1.  Her vital signs are stable, she is afebrile.  Her laboratories revealed a mild anemia.  She denied any suicidal ideation today. Principal Problem: <principal problem not specified> Diagnosis:   Patient Active Problem List   Diagnosis Date Noted  . Severe recurrent major depression without psychotic features (HCC) [F33.2] 07/15/2018  . Implanon removal [Z30.46] 07/17/2017   Total Time spent with patient: 30 minutes  Past Psychiatric History: See admission H&P  Past Medical History:  Past Medical History:  Diagnosis Date  . Asthma    History reviewed. No pertinent surgical history. Family History: History reviewed. No pertinent family history. Family Psychiatric  History: See admission H&P Social History:  Social History   Substance and Sexual Activity  Alcohol Use No     Social History   Substance and Sexual Activity  Drug Use Yes  . Types: Marijuana   Comment: Last used: This AM     Social History   Socioeconomic History  . Marital status: Single    Spouse name: Not on file  .  Number of children: Not on file  . Years of education: Not on file  . Highest education level: Not on file  Occupational History  . Not on file  Social Needs  . Financial resource strain: Not on file  . Food insecurity:    Worry: Not on file    Inability: Not on file  . Transportation needs:    Medical: Not on file    Non-medical: Not on file  Tobacco Use  . Smoking status: Passive Smoke Exposure - Never Smoker  . Smokeless tobacco: Never Used  Substance and Sexual Activity  . Alcohol use: No  . Drug use: Yes    Types: Marijuana    Comment: Last used: This AM   . Sexual activity: Yes    Birth control/protection: Implant  Lifestyle  . Physical activity:    Days per week: Not on file    Minutes per session: Not on file  . Stress: Not on file  Relationships  . Social connections:    Talks on phone: Not on file    Gets together: Not on file    Attends religious service: Not on file    Active member of club or organization: Not on file    Attends meetings of clubs or organizations: Not on file    Relationship status: Not on file  Other Topics Concern  . Not on file  Social History Narrative  . Not on file   Additional Social History:  Sleep: Fair  Appetite:  Good  Current Medications: Current Facility-Administered Medications  Medication Dose Route Frequency Provider Last Rate Last Dose  . acetaminophen (TYLENOL) tablet 650 mg  650 mg Oral Q6H PRN Nira Conn A, NP   650 mg at 07/17/18 0813  . albuterol (PROVENTIL HFA;VENTOLIN HFA) 108 (90 Base) MCG/ACT inhaler 2 puff  2 puff Inhalation Q6H PRN Antonieta Pert, MD      . alum & mag hydroxide-simeth (MAALOX/MYLANTA) 200-200-20 MG/5ML suspension 30 mL  30 mL Oral Q4H PRN Nira Conn A, NP      . citalopram (CELEXA) tablet 10 mg  10 mg Oral Once Antonieta Pert, MD      . Melene Muller ON 07/18/2018] citalopram (CELEXA) tablet 20 mg  20 mg Oral Daily Antonieta Pert, MD      .  feeding supplement (ENSURE ENLIVE) (ENSURE ENLIVE) liquid 237 mL  237 mL Oral BID BM Antonieta Pert, MD   237 mL at 07/16/18 1506  . hydrOXYzine (ATARAX/VISTARIL) tablet 25 mg  25 mg Oral TID PRN Nira Conn A, NP      . magnesium hydroxide (MILK OF MAGNESIA) suspension 30 mL  30 mL Oral Daily PRN Nira Conn A, NP   30 mL at 07/16/18 2047  . traZODone (DESYREL) tablet 50 mg  50 mg Oral QHS PRN Jackelyn Poling, NP        Lab Results:  Results for orders placed or performed during the hospital encounter of 07/15/18 (from the past 48 hour(s))  Hemoglobin A1c     Status: None   Collection Time: 07/16/18  6:23 AM  Result Value Ref Range   Hgb A1c MFr Bld 5.4 4.8 - 5.6 %    Comment: (NOTE) Pre diabetes:          5.7%-6.4% Diabetes:              >6.4% Glycemic control for   <7.0% adults with diabetes    Mean Plasma Glucose 108.28 mg/dL    Comment: Performed at [facility], [address] [place], [place] [address]  Lipid panel     Status: Abnormal   Collection Time: 07/16/18  6:23 AM  Result Value Ref Range   Cholesterol 192 0 - 200 mg/dL   Triglycerides 52 <308 mg/dL   HDL 47 >65 mg/dL   Total CHOL/HDL Ratio 4.1 RATIO   VLDL 10 0 - 40 mg/dL   LDL Cholesterol 784 (H) 0 - 99 mg/dL    Comment:        Total Cholesterol/HDL:CHD Risk Coronary Heart Disease Risk Table                     Men   Women  1/2 Average Risk   3.4   3.3  Average Risk       5.0   4.4  2 X Average Risk   9.6   7.1  3 X Average Risk  23.4   11.0        Use the calculated Patient Ratio above and the CHD Risk Table to determine the patient's CHD Risk.        ATP III CLASSIFICATION (LDL):  <100     mg/dL   Optimal  696-295  mg/dL   Near or Above                    Optimal  130-159  mg/dL   Borderline  284-132  mg/dL   High  >098     mg/dL   Very High Performed at [facility], [address], [place], [place] [address]   TSH     Status: None   Collection Time: 07/16/18  6:23  AM  Result Value Ref Range   TSH 1.531 0.350 - 4.500 uIU/mL    Comment: Performed by a 3rd Generation assay with a functional sensitivity of <=0.01 uIU/mL. Performed at [facility], [address], [place], [place] [address]     Blood Alcohol level:  Lab Results  Component Value Date   ETH <10 07/15/2018    Metabolic Disorder Labs: Lab Results  Component Value Date   HGBA1C 5.4 07/16/2018   MPG 108.28 07/16/2018   No results found for: PROLACTIN Lab Results  Component Value Date   CHOL 192 07/16/2018   TRIG 52 07/16/2018   HDL 47 07/16/2018   CHOLHDL 4.1 07/16/2018   VLDL 10 07/16/2018   LDLCALC 135 (H) 07/16/2018    Physical Findings: AIMS: Facial and Oral Movements Muscles of Facial Expression: None, normal Lips and Perioral Area: None, normal Jaw: None, normal Tongue: None, normal,Extremity Movements Upper (arms, wrists, hands, fingers): None, normal Lower (legs, knees, ankles, toes): None, normal, Trunk Movements Neck, shoulders, hips: None, normal, Overall Severity Severity of abnormal movements (highest score from questions above): None, normal Incapacitation due to abnormal movements: None, normal Patient's awareness of abnormal movements (rate only patient's report): No Awareness, Dental Status Current problems with teeth and/or dentures?: No Does patient usually wear dentures?: No  CIWA:  CIWA-Ar Total: 4 COWS:  COWS Total Score: 3  Musculoskeletal: Strength & Muscle Tone: within normal limits Gait & Station: normal Patient leans: N/A  Psychiatric Specialty Exam: Physical Exam  Nursing note and vitals reviewed. Constitutional: She is oriented to person, place, and time. She appears well-developed and well-nourished.  HENT:  Head: Normocephalic and atraumatic.  Respiratory: Effort normal.  Neurological: She is oriented to person, place, and time.    ROS  Blood pressure 111/74, pulse 74, temperature 97.7 F (36.5 C), resp.  rate 18, height 5\' 2"  (1.575 m), weight 63 kg, last menstrual period 07/08/2018.Body mass index is 25.42 kg/m.  General Appearance: Casual  Eye Contact:  Fair  Speech:  Normal Rate  Volume:  Decreased  Mood:  Depressed  Affect:  Congruent  Thought Process:  Coherent and Descriptions of Associations: Intact  Orientation:  Full (Time, Place, and Person)  Thought Content:  Logical  Suicidal Thoughts:  No  Homicidal Thoughts:  No  Memory:  Immediate;   Fair Recent;   Fair Remote;   Fair  Judgement:  Intact  Insight:  Fair  Psychomotor Activity:  Normal  Concentration:  Concentration: Fair and Attention Span: Fair  Recall:  [employer] of Knowledge:  Fair  Language:  Fair  Akathisia:  NA  Handed:  Right  AIMS (if indicated):     Assets:  Communication Skills Desire for Improvement Leisure Time Physical Health Resilience Talents/Skills  ADL's:  Intact  Cognition:  WNL  Sleep:  Number of Hours: 6.75     Treatment Plan Summary: Daily contact with patient to assess and evaluate symptoms and progress in treatment, Medication management and Plan : Patient is seen and examined.  Patient is a 22 year old female with the above-stated past psychiatric history who is seen in follow-up.  #1 depression/anxiety/PTSD-increase Celexa to 20 mg p.o. daily.  Continue Vistaril as needed for anxiety, continue trazodone  as needed for sleep.  #2 cocaine use disorder/marijuana use disorder-continue to encourage cessation of substances after discharge.  #3 dysuria-repeat urinalysis, give Diflucan 150 mg p.o. x1.  #4 housing-this may be a major issue for her.  I do not think that she can return home at this point, but this will have to be addressed by social work.  #5 disposition planning-in progress.  Antonieta Pert, MD 07/17/2018, 12:02 PM

## 2018-07-17 NOTE — [facility] Group Notes (Signed)
[facility] Group Notes:  (Nursing/MHT/Case Management/Adjunct)  Date:  07/17/2018  Time:  5:02 PM  Type of Therapy:  Nurse Education  Participation Level:  Active  Participation Quality:  Appropriate and Attentive  Affect:  Appropriate  Cognitive:  Alert and Appropriate  Insight:  Good  Engagement in Group:  Engaged  Modes of Intervention:  Discussion and Education  Summary of Progress/Problems: In this group, we reflected on the day and what each patient learned. We discussed how we can make changes to prevent returning to the hospital. We also discussed TIPP distress tolerance skills, and how to use them to bring down elevated emotion. Patients were educated on when to use distress tolerance skills.  Kirstie Mirza 07/17/2018, 5:02 PM

## 2018-07-17 NOTE — [facility] Group Notes (Signed)
[facility] LCSW Group Therapy Note  Date/Time:    07/17/2018   10:15 - 11:15 AM  Type of Therapy and Topic:  Group Therapy:  Thought Distortions  Participation Level:  Active   Description of Group:  In this group, patients were introduced to cognitive distortions and discussed how these can negatively affect lives.  This included All or Nothing thinking, Labeling, Focusing on the Negative, the Shoulds, Blaming, Predicting the Future, Overgeneralization, Mind Reading, Catastrophizing, Emotional Reasoning, Personalization, and Jumping to Conclusions.  Patients then used a worksheet to describe an upsetting event in their life and the Automatic Thoughts they had about that event.  They identified the type of cognitive distortion they used and worked with the group to come up with a more realistic thought to refute their original Automatic Thought.  Therapeutic Goals: 1. Patient will be able to identify this exercise of examining their thoughts as a healthy coping mechanism. 2. Patient will identify a problem area in their life and the automatic thoughts they have about that situation. 3. Patient will verbalize the feelings and outcomes typically associated with their thoughts. 4. Patient will think about and discuss the available evidence supporting their thoughts, as well as assumptions they have to make to believe it. 5. Patient will identify evidence or facts that refute their cognitive distortion. 6. Patient will verbalize a more realistic, helpful conclusion.  Summary of Patient Progress:  The patient expressed that she is in the hospital due to suicidal thoughts.  She was able to identify a number of cognitive distortions she has had recently in connection with her cousin telling her that she could no longer live there.  She was also able to listen attentively, grow in insight, and share her new cognitions/conclusions.   Therapeutic Modalities Cognitive Behavioral Therapy Dialectical Behavioral  Therapy  Ambrose Mantle, LCSW 07/17/2018 12:00PM

## 2018-07-17 NOTE — [facility] Group Notes (Signed)
Adult Psychoeducational Group Note  Date:  07/17/2018 Time:  10:29 PM  Group Topic/Focus:  Wrap-Up Group:   The focus of this group is to help patients review their daily goal of treatment and discuss progress on daily workbooks.  Participation Level:  Active  Participation Quality:  Appropriate and Attentive  Affect:  Appropriate  Cognitive:  Alert and Appropriate  Insight: Appropriate and Good  Engagement in Group:  Engaged  Modes of Intervention:  Discussion and Education  Additional Comments:  Pt attended and participated in wrap up group this evening. Pt rated their day a 10/10, due to them learning about releasing their emotions and they have also gotten closer to other pt's. Pt half completed their goal, which was to find coping skills for their anger. Pt is still working on communicating their feelings of anger.    Misty Wood 07/17/2018, 10:29 PM

## 2018-07-17 NOTE — [facility] Group Notes (Signed)
[facility] Group Notes:  (Nursing/MHT/Case Management/Adjunct)  Date:  07/17/2018  Time:  2:42 PM  Type of Therapy:  Nurse Education  Participation Level:  Active  Participation Quality:  Appropriate and Attentive  Affect:  Anxious and Tearful  Cognitive:  Alert and Appropriate  Insight:  Improving  Engagement in Group:  Distracting and Engaged  Modes of Intervention:  Discussion and Education  Summary of Progress/Problems: In this group, RN discussed needs assessment, behaviors and behavior modification. Patients were asked to share what things are needed every day to both live and thrive. They then discussed behaviors that result from not having our needs met, and ways they can make choices to change those behaviors. For homework, they were asked to take the love languages test listed in the workbook and begin to communicate their love languages to others here and at home.  Lesli Albee 07/17/2018, 2:42 PM

## 2018-07-18 DIAGNOSIS — F1990 Other psychoactive substance use, unspecified, uncomplicated: Secondary | ICD-10-CM

## 2018-07-18 DIAGNOSIS — R300 Dysuria: Secondary | ICD-10-CM

## 2018-07-18 NOTE — Progress Notes (Addendum)
Adult Psychoeducational Group Note  Date:  07/18/2018 Time:  8:51 PM  Group Topic/Focus:  Wrap-Up Group:   The focus of this group is to help patients review their daily goal of treatment and discuss progress on daily workbooks.  Participation Level:  Did not attend.  Participation Quality:    Affect:   Cognitive:   Insight:   Engagement in Group:    Modes of Intervention:    Additional Comments:    Kristine Linea 07/18/2018, 8:51 PM

## 2018-07-18 NOTE — [facility] Group Notes (Signed)
[facility] Group Notes:  (Nursing/MHT/Case Management/Adjunct)  Date:  07/18/2018  Time:  5:59 PM  Type of Therapy:  Nurse Education  Participation Level:  Active  Participation Quality:  Appropriate and Attentive  Affect:  Appropriate  Cognitive:  Alert and Appropriate  Insight:  Good  Engagement in Group:  Engaged  Modes of Intervention:  Discussion and Education  Summary of Progress/Problems: In this group, we identified unhealthy behaviors and their root causes. We discussed unhealthy "stinkin' thinkin'" and its impact on our emotional state and self esteem. We considered ways to boost self-esteem, such as positive self-talk.  Kirstie Mirza 07/18/2018, 5:59 PM

## 2018-07-18 NOTE — [facility] Group Notes (Signed)
[facility] LCSW Group Therapy Note  07/18/2018  10:00-11:00AM  Type of Therapy and Topic:  Group Therapy:  Adding Supports Including Being Your Own Support  Participation Level:  Active   Description of Group:  Patients in this group were introduced to the concept that additional supports including self-support are an essential part of recovery.  A song entitled "I Need Help!" was played and a group discussion was held in reaction to the idea of needing to add supports.  A song entitled "My Own Hero" was played and a group discussion ensued in which patients stated they could relate to the song and it inspired them to realize they have be willing to help themselves in order to succeed, because other people cannot achieve sobriety or stability for them.  We discussed adding a variety of healthy supports to address the various needs in their lives.  A song was played called "I Know Where I've Been" toward the end of group and used to conduct an inspirational wrap-up to group of remembering how far they have already come in their journey.  Therapeutic Goals: 1)  demonstrate the importance of being a part of one's own support system 2)  discuss reasons people in one's life may eventually be unable to be continually supportive  3)  identify the patient's current support system and   4)  elicit commitments to add healthy supports and to become more conscious of being self-supportive   Summary of Patient Progress:  The patient expressed that her younger siblings and 2 friends are healthy supports for her while at times her mother is healthy and at other times is unhealthy.  She was insightful as she listened to the songs and expressed what she got out of them.   Therapeutic Modalities:   Motivational Interviewing Activity  Lynnell Chad

## 2018-07-18 NOTE — Progress Notes (Signed)
D:  Patient's self inventory sheet, patient has poor sleep, no sleep medication given.  Good appetite, normal energy level, good concentration.  Denied depression and hopeless, rated anxiety 2.  Denied withdrawals.  Denied SI.  Physical problems, dizziness, blurred vision.  Physical pain, back, headache, worst pain #5 in past 24 hours.  Goal is learn how to pick herself up with having a bad day.  Plans to go over what she learned yesterday, attend groups.  Will discuss medications with MD.  Does have discharge plans. A:  Medications administered per MD orders.  Emotional support and encouragement given patient.

## 2018-07-18 NOTE — [facility] Suicide Risk Assessment (Addendum)
[facility] INPATIENT:  Family/Significant Other Suicide Prevention Education  Suicide Prevention Education:  Education Completed; Mother Galen Manila [phone] has been identified by the patient as the family member/significant other with whom the patient will be residing, and identified as the person(s) who will aid the patient in the event of a mental health crisis (suicidal ideations/suicide attempt).  With written consent from the patient, the family member/significant other has been provided the following suicide prevention education, prior to the and/or following the discharge of the patient.  THERE ARE NO GUNS IN THE HOME.  MOTHER FEELS SOMETHING IS WORKING WHILE SHE IS GETTING TREATMENT, AND SHE SOUNDS BETTER.  MOTHER WOULD LIKE A COPY OF RECORDS.  The suicide prevention education provided includes the following:  Suicide risk factors  Suicide prevention and interventions  National Suicide Hotline telephone number  [facility] assessment telephone number  [facility] Emergency Assistance 911  [facility] and/or Residential Mobile Crisis Unit telephone number  Request made of family/significant other to:  Remove weapons (e.g., guns, rifles, knives), all items previously/currently identified as safety concern.    Remove drugs/medications (over-the-counter, prescriptions, illicit drugs), all items previously/currently identified as a safety concern.  The family member/significant other verbalizes understanding of the suicide prevention education information provided.  The family member/significant other agrees to remove the items of safety concern listed above.  Carloyn Jaeger Grossman-Orr 07/18/2018, 4:27 PM

## 2018-07-18 NOTE — Progress Notes (Signed)
[facility] MD Progress Note  07/18/2018 11:00 AM Misty Wood  MRN:  [id number] Subjective: I took a nap after breakfast, as it was much needed.  I have been learning so many things since I been here.  I have learned that I need to be able to express myself and ask for things that I need such as reassurance.  We also talked about putting her thoughts on trial, which help me realize I can be wrong but feelings are right.  I even worked on Pharmacologist for anger yesterday, which include paced breathing and intense exercise.  Objective: Patient seen and case discussed with MD.  Patient is a 22 year old female with past psychiatric history significant for major depression, post traumatic stress disorder, and generalized anxiety.  On today's evaluation patient is much engaged with Clinical research associate, and offers a great amount of insight with improved judgment.  She is able to properly identify her reasons for admission, in addition to things that she has learned since being here.  She reports that today is much better than yesterday where she experienced a great deal of depression and anxiety, however has sought relief with her as needed medications and currently rates her depression and anxiety her depression and anxiety both 0 out of 10 with 10 being the worst.  She does complain about some visual disturbances that was prior to admission, which she describes as floaters red blue and purple.  She is encouraged to increase her fluid intake, and follow-up with optometry after hospitalization.  At this time she does not appear to be responding to internal stimuli, psychosis, and or thought blocking.  She continues to endorse poor sleeping habits.  She denies any suicidal thoughts, homicidal thoughts, and or hallucinations.  She is able to contract for safety at this time. Principal Problem: <principal problem not specified> Diagnosis:   Patient Active Problem List   Diagnosis Date Noted  . Severe recurrent major depression  without psychotic features (HCC) [F33.2] 07/15/2018  . Implanon removal [Z30.46] 07/17/2017   Total Time spent with patient: 30 minutes  Past Psychiatric History: See admission H&P  Past Medical History:  Past Medical History:  Diagnosis Date  . Asthma    History reviewed. No pertinent surgical history. Family History: History reviewed. No pertinent family history. Family Psychiatric  History: See admission H&P Social History:  Social History   Substance and Sexual Activity  Alcohol Use No     Social History   Substance and Sexual Activity  Drug Use Yes  . Types: Marijuana   Comment: Last used: This AM     Social History   Socioeconomic History  . Marital status: Single    Spouse name: Not on file  . Number of children: Not on file  . Years of education: Not on file  . Highest education level: Not on file  Occupational History  . Not on file  Social Needs  . Financial resource strain: Not on file  . Food insecurity:    Worry: Not on file    Inability: Not on file  . Transportation needs:    Medical: Not on file    Non-medical: Not on file  Tobacco Use  . Smoking status: Passive Smoke Exposure - Never Smoker  . Smokeless tobacco: Never Used  Substance and Sexual Activity  . Alcohol use: No  . Drug use: Yes    Types: Marijuana    Comment: Last used: This AM   . Sexual activity: Yes  Birth control/protection: Implant  Lifestyle  . Physical activity:    Days per week: Not on file    Minutes per session: Not on file  . Stress: Not on file  Relationships  . Social connections:    Talks on phone: Not on file    Gets together: Not on file    Attends religious service: Not on file    Active member of club or organization: Not on file    Attends meetings of clubs or organizations: Not on file    Relationship status: Not on file  Other Topics Concern  . Not on file  Social History Narrative  . Not on file   Additional Social History:                          Sleep: Poor  Appetite:  Fair  Current Medications: Current Facility-Administered Medications  Medication Dose Route Frequency Provider Last Rate Last Dose  . acetaminophen (TYLENOL) tablet 650 mg  650 mg Oral Q6H PRN Nira Conn A, NP   650 mg at 07/18/18 0806  . albuterol (PROVENTIL HFA;VENTOLIN HFA) 108 (90 Base) MCG/ACT inhaler 2 puff  2 puff Inhalation Q6H PRN Antonieta Pert, MD      . alum & mag hydroxide-simeth (MAALOX/MYLANTA) 200-200-20 MG/5ML suspension 30 mL  30 mL Oral Q4H PRN Nira Conn A, NP      . citalopram (CELEXA) tablet 20 mg  20 mg Oral Daily Antonieta Pert, MD   20 mg at 07/18/18 0803  . feeding supplement (ENSURE ENLIVE) (ENSURE ENLIVE) liquid 237 mL  237 mL Oral BID BM Antonieta Pert, MD   237 mL at 07/17/18 1219  . hydrOXYzine (ATARAX/VISTARIL) tablet 25 mg  25 mg Oral TID PRN Jackelyn Poling, NP   25 mg at 07/17/18 1303  . magnesium hydroxide (MILK OF MAGNESIA) suspension 30 mL  30 mL Oral Daily PRN Nira Conn A, NP   30 mL at 07/16/18 2047  . traZODone (DESYREL) tablet 50 mg  50 mg Oral QHS PRN Jackelyn Poling, NP        Lab Results:  Results for orders placed or performed during the hospital encounter of 07/15/18 (from the past 48 hour(s))  Urinalysis, Complete w Microscopic     Status: Abnormal   Collection Time: 07/17/18 12:05 PM  Result Value Ref Range   Color, Urine STRAW (A) YELLOW   APPearance CLEAR CLEAR   Specific Gravity, Urine 1.005 1.005 - 1.030   pH 7.0 5.0 - 8.0   Glucose, UA NEGATIVE NEGATIVE mg/dL   Hgb urine dipstick NEGATIVE NEGATIVE   Bilirubin Urine NEGATIVE NEGATIVE   Ketones, ur NEGATIVE NEGATIVE mg/dL   Protein, ur NEGATIVE NEGATIVE mg/dL   Nitrite NEGATIVE NEGATIVE   Leukocytes, UA NEGATIVE NEGATIVE   RBC / HPF 0-5 0 - 5 RBC/hpf   WBC, UA 0-5 0 - 5 WBC/hpf   Bacteria, UA NONE SEEN NONE SEEN   Squamous Epithelial / LPF 0-5 0 - 5   Mucus PRESENT     Comment: Performed at [facility], [address], [place], [place] [address]    Blood Alcohol level:  Lab Results  Component Value Date   ETH <10 07/15/2018    Metabolic Disorder Labs: Lab Results  Component Value Date   HGBA1C 5.4 07/16/2018   MPG 108.28 07/16/2018   No results found for: PROLACTIN Lab Results  Component Value Date  CHOL 192 07/16/2018   TRIG 52 07/16/2018   HDL 47 07/16/2018   CHOLHDL 4.1 07/16/2018   VLDL 10 07/16/2018   LDLCALC 135 (H) 07/16/2018    Physical Findings: AIMS: Facial and Oral Movements Muscles of Facial Expression: None, normal Lips and Perioral Area: None, normal Jaw: None, normal Tongue: None, normal,Extremity Movements Upper (arms, wrists, hands, fingers): None, normal Lower (legs, knees, ankles, toes): None, normal, Trunk Movements Neck, shoulders, hips: None, normal, Overall Severity Severity of abnormal movements (highest score from questions above): None, normal Incapacitation due to abnormal movements: None, normal Patient's awareness of abnormal movements (rate only patient's report): No Awareness, Dental Status Current problems with teeth and/or dentures?: No Does patient usually wear dentures?: No  CIWA:  CIWA-Ar Total: 4 COWS:  COWS Total Score: 3  Musculoskeletal: Strength & Muscle Tone: within normal limits Gait & Station: normal Patient leans: N/A  Psychiatric Specialty Exam: Physical Exam  Nursing note and vitals reviewed. Constitutional: She is oriented to person, place, and time. She appears well-developed and well-nourished.  HENT:  Head: Normocephalic and atraumatic.  Respiratory: Effort normal.  Neurological: She is oriented to person, place, and time.    ROS   Blood pressure 119/76, pulse 72, temperature 98.4 F (36.9 C), temperature source Oral, resp. rate 16, height 5\' 2"  (1.575 m), weight 63 kg, last menstrual period 07/08/2018.Body mass index is 25.42 kg/m.  General Appearance: Fairly Groomed  Eye Contact:  Good   Speech:  Clear and Coherent  Volume:  Normal but soft spoken  Mood:  Improving and brightens upon approach.  Affect:  Appropriate and Depressed  Thought Process:  Goal Directed, Linear and Descriptions of Associations: Intact  Orientation:  Full (Time, Place, and Person)  Thought Content:  WDL  Suicidal Thoughts:  Denies and contracts for safety  Homicidal Thoughts:  Denies  Memory:  Immediate;   Good Recent;   Good Remote;   Good  Judgement:  Fair  Insight:  Present  Psychomotor Activity:  Normal  Concentration:  Concentration: Good and Attention Span: Good  Recall:  Good  Fund of Knowledge:  Good  Language:  Good  Akathisia:  No  Handed:  Right  AIMS (if indicated):     Assets:  Communication Skills Desire for Improvement Leisure Time Physical Health Resilience Talents/Skills  ADL's:  Intact  Cognition:  WNL  Sleep:  Number of Hours: 6.75     Treatment Plan Summary: Daily contact with patient to assess and evaluate symptoms and progress in treatment and Medication management 1.  Depression-we will continue Celexa 20 mg p.o. daily last dose increase yesterday 07/17/2018. 2.  Substance use disorder-no additional changes to be made at this time.  We will continue to encourage its cessation of substances after discharge. 3.  Dysuria-UA obtained yesterday was normal.  Patient has completed Diflucan as prescribed.  Due to high risk behaviors patient may benefit from STD testing at this time in a setting of a normal urinalysis.  Maryagnes Amos, FNP 07/18/2018, 11:00 AM

## 2018-07-18 NOTE — Progress Notes (Signed)
D.  Pt pleasant on approach, denies complaints at this time.  Pt was positive for evening wrap up group, observed engaged in appropriate interaction with peers on the unit.  Pt denies SI/HI/AVH at this time.  A.  Support and encouragement offered  R.  Pt remains safe on the unit, will continue to monitor.   

## 2018-07-18 NOTE — Plan of Care (Signed)
Nurse discussed anxiety, depression and coping skills with patient.  

## 2018-07-18 NOTE — Progress Notes (Addendum)
Nurse called [facility], lab will add-on GC/Chamlydia per NP instructions.

## 2018-07-19 NOTE — [facility] Group Notes (Signed)
[facility] Group Notes:  (Nursing/MHT/Case Management/Adjunct)  Date:  07/19/2018  Time:  4:00 pm  Type of Therapy:  Psychoeducational Skills  Participation Level:  Active  Participation Quality:  Appropriate  Affect:  Appropriate  Cognitive:  Appropriate  Insight:  Appropriate  Engagement in Group:  Engaged  Modes of Intervention:  Discussion  Summary of Progress/Problems: Patient attended group and participated appropriately with positive statements for the group.   Misty Wood 07/19/2018, 5:42 PM

## 2018-07-19 NOTE — Plan of Care (Signed)
Nurse discussed anxiety, depression and coping skills with patient.  

## 2018-07-19 NOTE — Progress Notes (Signed)
D:  Patient's self inventory sheet, patient sleeps good, sleep medication helpful.  Good appetite, normal energy level, poor concentration.  Rated depression and anxiety 2, hopeless 1.  Denied withdrawals.  Denied SI.  Physical problems, lightheaded, dizziness.  Physical pain, back, lower stomach, cramps.  Pain medication is helpful.  Goal is calming anxiety, controlling outburst.  Plans to attend group and look through work book.  Worried about living situation.  Does have discharge plans. A:  Medications administered per MD orders.  Emotional support and encouragement given patient. R:  Denied SI and HI, contracts for safety.  Denied A/V hallucinations.  Safety maintained with 15 minute checks.

## 2018-07-19 NOTE — [facility] Group Notes (Signed)
Adult Psychoeducational Group Note  Date:  07/19/2018 Time:  9:43 PM  Group Topic/Focus:  Wrap-Up Group:   The focus of this group is to help patients review their daily goal of treatment and discuss progress on daily workbooks.  Participation Level:  Active  Participation Quality:  Appropriate and Attentive  Affect:  Appropriate  Cognitive:  Alert and Appropriate  Insight: Appropriate and Good  Engagement in Group:  Engaged  Modes of Intervention:  Discussion and Education  Additional Comments:  Pt attended and participated in wrap up group this evening. Pt rated their day a 9/10, due to their day starting off low. Pt did have a surprise phone and getting a visit from their siblings did boost their day. Pt completed their goal, which was to control their emotions and how they processed them.   Misty Wood 07/19/2018, 9:43 PM

## 2018-07-19 NOTE — Progress Notes (Signed)
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  Patient is a 22 year old female with a past psychiatric history significant for major depression, posttraumatic stress disorder, generalized anxiety disorder.  She presented to the [facility] emergency department on 07/15/2018 with suicidal ideation.  Objective: Patient is seen and examined.  Patient stated that she had a difficult day yesterday.  She tried to talk to her mother again, and at the end of the conversation they were arguing.  The patient's mother stated that the patient was not ready to return to the mother's home.  She is upset by that.  She is concerned about homelessness.  She is remained in her room this morning, and is laying in bed.  We discussed that.  We discussed the need for her to participate in her care, and that if she did not do that she would end up being homeless.  We also talked about compromising to do what is best for her in the long run.  She has some degree of insight to this.  She did get on the phone and called her grandmother, and her grandmother is willing to house her after discharge.  She denied any suicidal ideation.  Her mood still is a bit labile, and she has minimal coping skills at this point.  She denied any side effects to her current medications.  Vital signs are stable, she is afebrile.  She slept 6.25 hours last night.  Laboratories revealed a mild anemia. Principal Problem: <principal problem not specified> Diagnosis:   Patient Active Problem List   Diagnosis Date Noted  . Severe recurrent major depression without psychotic features (HCC) [F33.2] 07/15/2018  . Implanon removal [Z30.46] 07/17/2017   Total Time spent with patient: 20 minutes  Past Psychiatric History: See admission H&P  Past Medical History:  Past Medical History:  Diagnosis Date  . Asthma    History reviewed. No pertinent surgical  history. Family History: History reviewed. No pertinent family history. Family Psychiatric  History: See admission H&P Social History:  Social History   Substance and Sexual Activity  Alcohol Use No     Social History   Substance and Sexual Activity  Drug Use Yes  . Types: Marijuana   Comment: Last used: This AM     Social History   Socioeconomic History  . Marital status: Single    Spouse name: Not on file  . Number of children: Not on file  . Years of education: Not on file  . Highest education level: Not on file  Occupational History  . Not on file  Social Needs  . Financial resource strain: Not on file  . Food insecurity:    Worry: Not on file    Inability: Not on file  . Transportation needs:    Medical: Not on file    Non-medical: Not on file  Tobacco Use  . Smoking status: Passive Smoke Exposure - Never Smoker  . Smokeless tobacco: Never Used  Substance and Sexual Activity  . Alcohol use: No  . Drug use: Yes    Types: Marijuana    Comment: Last used: This AM   . Sexual activity: Yes    Birth control/protection: Implant  Lifestyle  . Physical activity:    Days per week: Not on file    Minutes per session: Not on file  . Stress: Not on file  Relationships  . Social connections:  Talks on phone: Not on file    Gets together: Not on file    Attends religious service: Not on file    Active member of club or organization: Not on file    Attends meetings of clubs or organizations: Not on file    Relationship status: Not on file  Other Topics Concern  . Not on file  Social History Narrative  . Not on file   Additional Social History:                         Sleep: Good  Appetite:  Good  Current Medications: Current Facility-Administered Medications  Medication Dose Route Frequency Provider Last Rate Last Dose  . acetaminophen (TYLENOL) tablet 650 mg  650 mg Oral Q6H PRN Nira Conn A, NP   650 mg at 07/18/18 0806  . albuterol  (PROVENTIL HFA;VENTOLIN HFA) 108 (90 Base) MCG/ACT inhaler 2 puff  2 puff Inhalation Q6H PRN Antonieta Pert, MD      . alum & mag hydroxide-simeth (MAALOX/MYLANTA) 200-200-20 MG/5ML suspension 30 mL  30 mL Oral Q4H PRN Nira Conn A, NP      . citalopram (CELEXA) tablet 20 mg  20 mg Oral Daily Antonieta Pert, MD   20 mg at 07/19/18 0735  . feeding supplement (ENSURE ENLIVE) (ENSURE ENLIVE) liquid 237 mL  237 mL Oral BID BM Antonieta Pert, MD   237 mL at 07/17/18 1219  . hydrOXYzine (ATARAX/VISTARIL) tablet 25 mg  25 mg Oral TID PRN Jackelyn Poling, NP   25 mg at 07/19/18 0737  . magnesium hydroxide (MILK OF MAGNESIA) suspension 30 mL  30 mL Oral Daily PRN Nira Conn A, NP   30 mL at 07/16/18 2047  . traZODone (DESYREL) tablet 50 mg  50 mg Oral QHS PRN Nira Conn A, NP   50 mg at 07/18/18 2124    Lab Results: No results found for this or any previous visit (from the past 48 hour(s)).  Blood Alcohol level:  Lab Results  Component Value Date   ETH <10 07/15/2018    Metabolic Disorder Labs: Lab Results  Component Value Date   HGBA1C 5.4 07/16/2018   MPG 108.28 07/16/2018   No results found for: PROLACTIN Lab Results  Component Value Date   CHOL 192 07/16/2018   TRIG 52 07/16/2018   HDL 47 07/16/2018   CHOLHDL 4.1 07/16/2018   VLDL 10 07/16/2018   LDLCALC 135 (H) 07/16/2018    Physical Findings: AIMS: Facial and Oral Movements Muscles of Facial Expression: None, normal Lips and Perioral Area: None, normal Jaw: None, normal Tongue: None, normal,Extremity Movements Upper (arms, wrists, hands, fingers): None, normal Lower (legs, knees, ankles, toes): None, normal, Trunk Movements Neck, shoulders, hips: None, normal, Overall Severity Severity of abnormal movements (highest score from questions above): None, normal Incapacitation due to abnormal movements: None, normal Patient's awareness of abnormal movements (rate only patient's report): No Awareness, Dental  Status Current problems with teeth and/or dentures?: No Does patient usually wear dentures?: No  CIWA:  CIWA-Ar Total: 1 COWS:  COWS Total Score: 1  Musculoskeletal: Strength & Muscle Tone: within normal limits Gait & Station: normal Patient leans: N/A  Psychiatric Specialty Exam: Physical Exam  Nursing note and vitals reviewed. Constitutional: She is oriented to person, place, and time. She appears well-developed and well-nourished.  HENT:  Head: Normocephalic and atraumatic.  Respiratory: Effort normal.  Neurological: She is alert and oriented to person,  place, and time.    ROS  Blood pressure 109/69, pulse 77, temperature 98.2 F (36.8 C), temperature source Oral, resp. rate 20, height 5\' 2"  (1.575 m), weight 63 kg, last menstrual period 07/08/2018.Body mass index is 25.42 kg/m.  General Appearance: Casual  Eye Contact:  Fair  Speech:  Normal Rate  Volume:  Decreased  Mood:  Anxious and Depressed  Affect:  Congruent  Thought Process:  Coherent and Descriptions of Associations: Intact  Orientation:  Full (Time, Place, and Person)  Thought Content:  Logical  Suicidal Thoughts:  No  Homicidal Thoughts:  No  Memory:  Immediate;   Fair Recent;   Fair Remote;   Fair  Judgement:  Fair  Insight:  Lacking  Psychomotor Activity:  Normal  Concentration:  Concentration: Fair and Attention Span: Fair  Recall:  [employer] of Knowledge:  Fair  Language:  Good  Akathisia:  Negative  Handed:  Right  AIMS (if indicated):     Assets:  Communication Skills Desire for Improvement Physical Health Resilience  ADL's:  Intact  Cognition:  WNL  Sleep:  Number of Hours: 6.25     Treatment Plan Summary: Daily contact with patient to assess and evaluate symptoms and progress in treatment, Medication management and Plan : Patient is seen and examined.  Patient is a 22 year old female with the above-stated past psychiatric history who is seen in follow-up.  #1  depression/anxiety/PTSD-continue Celexa 20 mg p.o. daily.  Continue Vistaril as needed for anxiety.  Continue trazodone as needed for sleep.  #2 cocaine use disorder/marijuana use disorder-continue to encourage cessation of substances after discharge.  #3 dysuria-he received Diflucan 150 mg p.o. x1.  #4 housing-she has contacted her grandmother who is willing to letter stay with her after discharge.  #5 disposition planning-if all goes well we will plan on discharge tomorrow with psychiatric follow-up and she would go to her grandmother's home.  Antonieta Pert, MD 07/19/2018, 1:21 PM

## 2018-07-19 NOTE — Progress Notes (Signed)
Recreation Therapy Notes  Date: 10.14.19 Time: 0930 Location: [address] Dayroom  Group Topic: Stress Management  Goal Area(s) Addresses:  Patient will verbalize importance of using healthy stress management.  Patient will identify positive emotions associated with healthy stress management.  Intervention: Stress Management  Activity :  Guided Imagery.  LRT introduced patients to the stress management technique of guided imagery.    Education:  Stress Management, Discharge Planning.   Education Outcome: Acknowledges edcuation/In group clarification offered/Needs additional education  Clinical Observations/Feedback: Pt did not attend group.    Caroll Rancher, LRT/CTRS         Caroll Rancher A 07/19/2018 12:44 PM

## 2018-07-19 NOTE — Progress Notes (Signed)
D: Pt was in bed in her room upon initial approach.  Pt presents with anxious, depressed affect and mood.  She was tearful upon initial approach.  She describes her day as "all right" and reports goal is "trying to control my anger."  Pt states "I feel like my mom doesn't understand.  She doesn't even want me to come home.  She doesn't feel like I'm respectful but she cuts me off when I'm trying to talk to her and then she got up and left."  Pt was encouraged to write a letter to her mother.  Pt denies SI/HI, denies hallucinations, denies pain.  Pt has been visible in milieu interacting with peers and staff appropriately.  She did not attend evening group tonight.   A: Introduced self to pt.  Actively listened to pt and offered support and encouragement.  PRN medication administered for anxiety and sleep.  Pt provided with journal.  Q15 minute safety checks maintained.  R: Pt is safe on the unit.  She reports she wrote a letter to give to her mother.  Pt is compliant with medications.  Pt verbally contracts for safety.  Will continue to monitor and assess.

## 2018-07-20 DIAGNOSIS — G4700 Insomnia, unspecified: Secondary | ICD-10-CM

## 2018-07-20 DIAGNOSIS — F4310 Post-traumatic stress disorder, unspecified: Secondary | ICD-10-CM

## 2018-07-20 MED ORDER — TRAZODONE HCL 50 MG PO TABS: 50.0000 mg | ORAL_TABLET | Freq: Every evening | ORAL | 0 refills | Status: DC | PRN

## 2018-07-20 MED ORDER — HYDROXYZINE HCL 25 MG PO TABS: 25.0000 mg | ORAL_TABLET | Freq: Three times a day (TID) | ORAL | 0 refills | Status: DC | PRN

## 2018-07-20 MED ORDER — ALBUTEROL SULFATE HFA 108 (90 BASE) MCG/ACT IN AERS: 2.0000 | INHALATION_SPRAY | Freq: Four times a day (QID) | RESPIRATORY_TRACT | 2 refills | Status: DC | PRN

## 2018-07-20 MED ORDER — CITALOPRAM HYDROBROMIDE 20 MG PO TABS: 20.0000 mg | ORAL_TABLET | Freq: Every day | ORAL | 0 refills | Status: DC

## 2018-07-20 NOTE — Plan of Care (Signed)
Discharge Note  Patient verbalizes readiness for discharge. Follow up plan explained, AVS, Transition record and SRA given. Prescriptions and teaching provided. Belongings returned and signed for. Suicide safety plan completed and signed. Patient verbalizes understanding. Patient denies SI/HI and assures this Clinical research associate he will seek assistance should that change. Patient discharged to lobby where mother was waiting.  Problem: Education: Goal: Knowledge of [place] [facility] Education information/materials will improve Outcome: Adequate for Discharge Goal: Emotional status will improve Outcome: Adequate for Discharge Goal: Mental status will improve Outcome: Adequate for Discharge Goal: Verbalization of understanding the information provided will improve Outcome: Adequate for Discharge   Problem: Activity: Goal: Interest or engagement in activities will improve Outcome: Adequate for Discharge Goal: Sleeping patterns will improve Outcome: Adequate for Discharge   Problem: Coping: Goal: Ability to verbalize frustrations and anger appropriately will improve Outcome: Adequate for Discharge Goal: Ability to demonstrate self-control will improve Outcome: Adequate for Discharge   Problem: Health Behavior/Discharge Planning: Goal: Identification of resources available to assist in meeting health care needs will improve Outcome: Adequate for Discharge Goal: Compliance with treatment plan for underlying cause of condition will improve Outcome: Adequate for Discharge   Problem: Physical Regulation: Goal: Ability to maintain clinical measurements within normal limits will improve Outcome: Adequate for Discharge   Problem: Safety: Goal: Periods of time without injury will increase Outcome: Adequate for Discharge   Problem: Coping: Goal: Coping ability will improve Outcome: Adequate for Discharge Goal: Will verbalize feelings Outcome: Adequate for Discharge   Problem: Safety: Goal:  Ability to identify and utilize support systems that promote safety will improve Outcome: Adequate for Discharge   Problem: Self-Concept: Goal: Level of anxiety will decrease Outcome: Adequate for Discharge   Problem: Health Behavior/Discharge Planning: Goal: Identification of resources available to assist in meeting health care needs will improve Outcome: Adequate for Discharge   Problem: Medication: Goal: Compliance with prescribed medication regimen will improve Outcome: Adequate for Discharge

## 2018-07-20 NOTE — Progress Notes (Signed)
Recreation Therapy Notes  Animal-Assisted Activity (AAA) Program Checklist/Progress Notes Patient Eligibility Criteria Checklist & Daily Group note for Rec Tx Intervention  Date: 10.15.19 Time: 1430 Location: [address] Dayroom   AAA/T Program Assumption of Risk Form signed by Patient/ or Parent Legal Guardian YES   Patient is free of allergies or sever asthma YES   Patient reports no fear of animals YES  Patient reports no history of cruelty to animals YES   Patient understands his/her participation is voluntary YES   Patient washes hands before animal contact YES   Patient washes hands after animal contact  YES   Behavioral Response: Engaged  Education: Hand Washing, Appropriate Animal Interaction   Education Outcome: Acknowledges understanding/In group clarification offered/Needs additional education.   Clinical Observations/Feedback: Pt attended and participated in activity.    [name], LRT/CTRS         Misty Wood 07/20/2018 3:42 PM 
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
## 2018-07-20 NOTE — BHH Group Notes (Signed)
LCSW Group Therapy Note 07/20/2018 2:30 PM  Type of Therapy/Topic: Group Therapy: Feelings about Diagnosis  Participation Level: Active   Description of Group:  This group will allow patients to explore their thoughts and feelings about diagnoses they have received. Patients will be guided to explore their level of understanding and acceptance of these diagnoses. Facilitator will encourage patients to process their thoughts and feelings about the reactions of others to their diagnosis and will guide patients in identifying ways to discuss their diagnosis with significant others in their lives. This group will be process-oriented, with patients participating in exploration of their own experiences, giving and receiving support, and processing challenge from other group members.  Therapeutic Goals: 1. Patient will demonstrate understanding of diagnosis as evidenced by identifying two or more symptoms of the disorder 2. Patient will be able to express two feelings regarding the diagnosis 3. Patient will demonstrate their ability to communicate their needs through discussion and/or role play  Summary of Patient Progress:  Misty Wood was engaged and participated throughout the group session. Misty Wood states that initially she felt "bad" when she learned she had a mental health diagnosis however recently she has accepted that her mental health illness does not "make me who I am".     Therapeutic Modalities:  Cognitive Behavioral Therapy Brief Therapy Feelings Identification    Misty Wood Misty Wood Clinical Social Worker

## 2018-07-20 NOTE — Progress Notes (Signed)
  [facility] Adult Case Management Discharge Plan :  Will you be returning to the same living situation after discharge:  Yes,  patient reports she is returning home today with her mother At discharge, do you have transportation home?: Yes,  patient's mother will pick her up at discharge Do you have the ability to pay for your medications: No.  Release of information consent forms completed and in the chart;  Patient's signature needed at discharge.  Patient to Follow up at: Follow-up Information    [facility]. Go on 07/23/2018.   Specialty:  Behavioral Health Why:  Appointment for medication management and therapy services is Friday, 07/23/18 at 8:00am. Please be sure to bring your Photo ID, any insurance information and any discharge paperwork from this hospitalizaton.  Contact information: [address] [place] [address] [phone]           Next level of care provider has access to [facility] Link:yes  Safety Planning and Suicide Prevention discussed: Yes,  with the patient's mother  Have you used any form of tobacco in the last 30 days? (Cigarettes, Smokeless Tobacco, Cigars, and/or Pipes): No  Has patient been referred to the Quitline?: N/A patient is not a smoker  Patient has been referred for addiction treatment: N/A  ARRIELLE MCGINN, LCSWA 07/20/2018, 2:43 PM

## 2018-07-20 NOTE — BHH Suicide Risk Assessment (Signed)
[facility] Discharge Suicide Risk Assessment   Principal Problem: Depression Discharge Diagnoses:  Patient Active Problem List   Diagnosis Date Noted  . Severe recurrent major depression without psychotic features (HCC) [F33.2] 07/15/2018  . Implanon removal [Z30.46] 07/17/2017    Total Time spent with patient: 30 minutes  Musculoskeletal: Strength & Muscle Tone: within normal limits Gait & Station: normal Patient leans: N/A  Psychiatric Specialty Exam: ROS no headache, no chest pain, no shortness of breath, no fever, no chills   Blood pressure (!) 102/59, pulse 80, temperature 97.9 F (36.6 C), temperature source Oral, resp. rate 16, height 5\' 2"  (1.575 m), weight 63 kg, last menstrual period 07/08/2018.Body mass index is 25.42 kg/m.  General Appearance: Well Groomed  Eye Contact::  Good  Speech:  Normal Rate409  Volume:  Normal  Mood:  reports her mood is " a lot better" and describes " back to normal"  Affect:  Appropriate and reactive  Thought Process:  Linear and Descriptions of Associations: Intact  Orientation:  Full (Time, Place, and Person)  Thought Content:  no hallucinations, no delusions, not internally preoccupied   Suicidal Thoughts:  No denies suicidal or self injurious ideations, denies any homicidal or violent ideations  Homicidal Thoughts:  No  Memory:  recent and remote grossly intact   Judgement:  Other:  improving   Insight:  improving   Psychomotor Activity:  Normal  Concentration:  Good  Recall:  Good  Fund of Knowledge:Good  Language: Good  Akathisia:  Negative  Handed:  Right  AIMS (if indicated):     Assets:  Desire for Improvement Resilience  Sleep:  Number of Hours: 6  Cognition: WNL  ADL's:  Intact   Mental Status Per Nursing Assessment::   On Admission:  Suicidal ideation indicated by patient, Suicide plan, Plan includes specific time, place, or method, Self-harm thoughts, Self-harm behaviors, Intention to act on suicide plan, Belief that  plan would result in death  Demographic Factors:  22 year single female, no children, employed, lives with mother  Loss Factors: Work and financial stressors  Historical Factors: No prior psychiatric admissions, no history of suicide attempts, denies history of self cutting, no history of psychosis  Risk Reduction Factors:   Employed, Living with another person, especially a relative and Positive coping skills or problem solving skills  Continued Clinical Symptoms:  At this time patient is alert, attentive, well related, calm, mood described as improved, affect full in range, no thought disorder, no suicidal or self injurious ideations, denies homicidal or violent ideations, no hallucinations, no delusions, not internally preoccupied, future oriented . Denies medication side effects. We reviewed medication side effects, including potential risk of increased suicidal ideations early in treatment with antidepressants in young adults  Behavior on unit calm, pleasant on approach  Cognitive Features That Contribute To Risk:  No gross cognitive deficits noted upon discharge. Is alert , attentive, and oriented x 3   Suicide Risk:  No gross cognitive deficits noted upon discharge. Is alert , attentive, and oriented x 3   Follow-up Information    [facility]. Go on 07/23/2018.   Specialty:  Behavioral Health Why:  Appointment for medication management and therapy services is Friday, 07/23/18 at 8:00am. Please be sure to bring your Photo ID, any insurance information and any discharge paperwork from this hospitalizaton.  Contact information: [address] [place] [address] [phone]           Plan Of Care/Follow-up recommendations:  Activity:  as tolerated  Diet:  regular Tests:  NA Other:  See below  Patient is expressing readiness for discharge, and is leaving unit in good spirits Plans to go stay with grandmother in [place] for a week, and then return to [facility]   Follow up as above  Craige Cotta, MD 07/20/2018, 2:39 PM

## 2018-07-20 NOTE — Progress Notes (Signed)
Patient ID: Misty Wood, female   DOB: 21-Oct-1995, 21 y.o.   MRN: [id number] D: Patient observed watching TV and interacting well with peers on approach. Pt mood and affect appears anxious. Pt attended evening wrap up group and engaged in discussions. Denies  SI/HI/AVH and pain.No behavioral issues noted.  A: Support and encouragement offered as needed to express needs. Medications administered as prescribed.  R: Patient is safe and cooperative on unit. Will continue to monitor  for safety and stability.

## 2018-07-22 NOTE — Progress Notes (Signed)
Patient contacted CSW from home and requested a letter to return to work be emailed to her.   Patient provided her e mail address "[email or web address]".   CSW will ensure the patient receives this documentation.    Baldo Daub, MSW, LCSWA Clinical Social Worker [facility]  Phone: [phone]

## 2018-08-23 ENCOUNTER — Encounter (HOSPITAL_COMMUNITY): Payer: Self-pay | Admitting: Emergency Medicine

## 2018-08-23 ENCOUNTER — Ambulatory Visit (HOSPITAL_COMMUNITY)
Admission: EM | Admit: 2018-08-23 | Discharge: 2018-08-23 | Disposition: A | Discharge status: Home / Self Care | Payer: Medicaid Other | Attending: Family Medicine | Admitting: Family Medicine

## 2018-08-23 DIAGNOSIS — H9201 Otalgia, right ear: Secondary | ICD-10-CM

## 2018-08-23 MED ORDER — FLUTICASONE PROPIONATE 50 MCG/ACT NA SUSP: 2.0000 | Freq: Every day | NASAL | 0 refills | Status: DC

## 2018-08-23 NOTE — ED Provider Notes (Signed)
[facility]    CSN: [id number] Arrival date & time: 08/23/18  1359     History   Chief Complaint Chief Complaint  Patient presents with  . Foreign Body in Ear    HPI Misty Wood is a [age] y.o. female.   22 year old female comes in for possible foreign body in right ear. States she was using cotton swab to clean the ear, and noticed that the tip was no longer there. States mild right ear pain. No ear drainage. No URI symptoms such as cough, congestion, sore throat. Denies fever, chills, night sweats. States tried to use tweezers to remove the cotton tip, but was unable to find it.      Past Medical History:  Diagnosis Date  . Asthma     Patient Active Problem List   Diagnosis Date Noted  . 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 Implanon removal 07/17/2017    History reviewed. No pertinent surgical history.  OB History   None      Home Medications    Prior to Admission medications   Medication Sig Start Date End Date Taking? Authorizing Provider  albuterol (PROVENTIL HFA;VENTOLIN HFA) 108 (90 Base) MCG/ACT inhaler Inhale 2 puffs into the lungs every 6 (six) hours as needed for wheezing or shortness of breath. 07/20/18   Armandina StammerNwoko, Agnes I, NP  citalopram (CELEXA) 20 MG tablet Take 1 tablet (20 mg total) by mouth daily. For depression 07/21/18   Armandina StammerNwoko, Agnes I, NP  fluticasone (FLONASE) 50 MCG/ACT nasal spray Place 2 sprays into both nostrils daily. 08/23/18   Cathie HoopsYu, Keghan Mcfarren V, PA-C  hydrOXYzine (ATARAX/VISTARIL) 25 MG tablet Take 1 tablet (25 mg total) by mouth 3 (three) times daily as needed for anxiety. 07/20/18   Armandina StammerNwoko, Agnes I, NP  traZODone (DESYREL) 50 MG tablet Take 1 tablet (50 mg total) by mouth at bedtime as needed for sleep. 07/20/18   Sanjuana KavaNwoko, Agnes I, NP    Family History History reviewed. No pertinent family history.  Social History Social History   Tobacco Use  . Smoking status: Passive Smoke  Exposure - Never Smoker  . Smokeless tobacco: Never Used  Substance Use Topics  . Alcohol use: No  . Drug use: Yes    Types: Marijuana    Comment: Last used: This AM      Allergies   Penicillins   Review of Systems Review of Systems  Reason unable to perform ROS: See HPI as above.     Physical Exam Triage Vital Signs ED Triage Vitals  Enc Vitals Group     BP 08/23/18 1452 (!) 101/52     Pulse Rate 08/23/18 1452 83     Resp 08/23/18 1452 18     Temp 08/23/18 1452 98.4 F (36.9 C)     Temp Source 08/23/18 1452 Oral     SpO2 08/23/18 1452 100 %     Weight --      Height --      Head Circumference --      Peak Flow --      Pain Score 08/23/18 1453 1     Pain Loc --      Pain Edu? --      Excl. in GC? --    No data found.  Updated Vital Signs BP (!) 101/52 (BP Location: Left Arm)   Pulse 83   Temp 98.4 F (36.9 C) (Oral)   Resp 18   SpO2  100%   Physical Exam  Constitutional: She is oriented to person, place, and time. She appears well-developed and well-nourished. No distress.  HENT:  Head: Normocephalic and atraumatic.  Right Ear: External ear and ear canal normal. Tympanic membrane is not perforated, not erythematous and not bulging. A middle ear effusion is present.  Left Ear: Tympanic membrane, external ear and ear canal normal. Tympanic membrane is not erythematous and not bulging.  No foreign body seen.   Eyes: Pupils are equal, round, and reactive to light. Conjunctivae are normal.  Neurological: She is alert and oriented to person, place, and time.  Skin: She is not diaphoretic.     UC Treatments / Results  Labs (all labs ordered are listed, but only abnormal results are displayed) Labs Reviewed - No data to display  EKG None  Radiology No results found.  Procedures Procedures (including critical care time)  Medications Ordered in UC Medications - No data to display  Initial Impression / Assessment and Plan / UC Course  I have  reviewed the triage vital signs and the nursing notes.  Pertinent labs & imaging results that were available during my care of the patient were reviewed by me and considered in my medical decision making (see chart for details).    No foreign body seen. Discussed mid ear effusion that may be related to right ear pain, could also be incidental finding. flonase provided for mid ear effusion. Recheck as needed.  Final Clinical Impressions(s) / UC Diagnoses   Final diagnoses:  Right ear pain    ED Prescriptions    Medication Sig Dispense Auth. Provider   fluticasone (FLONASE) 50 MCG/ACT nasal spray Place 2 sprays into both nostrils daily. 1 g Threasa Alpha, [place] 08/23/18 [phone]

## 2018-08-23 NOTE — Discharge Instructions (Signed)
No foreign body in the right ear. As discussed, you do have some bubbles in the back of the ear drum, which can cause ear pain. Start flonase for possible eustachian tube dysfunction. Recheck as needed.

## 2018-08-23 NOTE — ED Triage Notes (Signed)
Pt here with cotton in right ear

## 2018-11-14 ENCOUNTER — Emergency Department (HOSPITAL_COMMUNITY): Payer: Medicaid Other

## 2018-11-14 ENCOUNTER — Emergency Department (HOSPITAL_COMMUNITY)
Admission: EM | Admit: 2018-11-14 | Discharge: 2018-11-14 | Disposition: A | Discharge status: Home / Self Care | Payer: Medicaid Other | Attending: Emergency Medicine | Admitting: Emergency Medicine

## 2018-11-14 ENCOUNTER — Other Ambulatory Visit: Payer: Self-pay

## 2018-11-14 ENCOUNTER — Encounter (HOSPITAL_COMMUNITY): Payer: Self-pay | Admitting: Emergency Medicine

## 2018-11-14 DIAGNOSIS — N898 Other specified noninflammatory disorders of vagina: Secondary | ICD-10-CM

## 2018-11-14 DIAGNOSIS — J309 Allergic rhinitis, unspecified: Secondary | ICD-10-CM | POA: Insufficient documentation

## 2018-11-14 DIAGNOSIS — N760 Acute vaginitis: Secondary | ICD-10-CM | POA: Insufficient documentation

## 2018-11-14 DIAGNOSIS — R102 Pelvic and perineal pain: Secondary | ICD-10-CM | POA: Insufficient documentation

## 2018-11-14 DIAGNOSIS — Z7722 Contact with and (suspected) exposure to environmental tobacco smoke (acute) (chronic): Secondary | ICD-10-CM | POA: Insufficient documentation

## 2018-11-14 DIAGNOSIS — J310 Chronic rhinitis: Secondary | ICD-10-CM

## 2018-11-14 DIAGNOSIS — N72 Inflammatory disease of cervix uteri: Secondary | ICD-10-CM | POA: Insufficient documentation

## 2018-11-14 DIAGNOSIS — B9689 Other specified bacterial agents as the cause of diseases classified elsewhere: Secondary | ICD-10-CM

## 2018-11-14 DIAGNOSIS — Z79899 Other long term (current) drug therapy: Secondary | ICD-10-CM | POA: Insufficient documentation

## 2018-11-14 LAB — URINALYSIS, ROUTINE W REFLEX MICROSCOPIC
BACTERIA UA: NONE SEEN
BILIRUBIN URINE: NEGATIVE
Glucose, UA: NEGATIVE mg/dL
HGB URINE DIPSTICK: NEGATIVE
Ketones, ur: NEGATIVE mg/dL
NITRITE: NEGATIVE
PH: 6 (ref 5.0–8.0)
PROTEIN: NEGATIVE mg/dL
Specific Gravity, Urine: 1.015 (ref 1.005–1.030)

## 2018-11-14 LAB — RPR: RPR: NONREACTIVE

## 2018-11-14 LAB — WET PREP, GENITAL
SPERM: NONE SEEN
Trich, Wet Prep: NONE SEEN
Yeast Wet Prep HPF POC: NONE SEEN

## 2018-11-14 LAB — HIV ANTIBODY (ROUTINE TESTING W REFLEX): HIV SCREEN 4TH GENERATION: NONREACTIVE

## 2018-11-14 LAB — I-STAT BETA HCG BLOOD, ED (MC, WL, AP ONLY)

## 2018-11-14 MED ORDER — METRONIDAZOLE 500 MG PO TABS: 500.0000 mg | ORAL_TABLET | Freq: Two times a day (BID) | ORAL | 0 refills | Status: DC

## 2018-11-14 MED ORDER — CETIRIZINE HCL 10 MG PO TABS: 10.0000 mg | ORAL_TABLET | Freq: Every day | ORAL | 0 refills | Status: DC

## 2018-11-14 MED ORDER — ALBUTEROL SULFATE HFA 108 (90 BASE) MCG/ACT IN AERS: 2.0000 | INHALATION_SPRAY | Freq: Four times a day (QID) | RESPIRATORY_TRACT | 2 refills | Status: AC | PRN

## 2018-11-14 MED ORDER — DOXYCYCLINE HYCLATE 100 MG PO CAPS: 100.0000 mg | ORAL_CAPSULE | Freq: Two times a day (BID) | ORAL | 0 refills | Status: AC

## 2018-11-14 NOTE — ED Triage Notes (Signed)
Pt. Stated, I think my asthmas is making me have SOB . Im having chest pain, SOB, sore throat and a very dry mouth.

## 2018-11-14 NOTE — ED Provider Notes (Signed)
[facility]   CSN: [id number] Arrival date & time: 11/14/18  0744     History   Chief Complaint Chief Complaint  Patient presents with  . Sore Throat  . Shortness of Breath  . Chest Pain    HPI Misty Wood is a 23 y.o. female presenting for evaluation of sore throat, nasal congestion, shortness of breath, and cough.  Patient states that the past couple days, her nose has been very congested.  As such, when she sleeps she is breathing through her mouth and her throat is dry and sore.  When she wakes up, she feels like there is a lot of drainage and she has to cough.  She feels like this is causing her asthma to worsen.  Symptoms are mild during the day, worse at night.  She denies fevers, chills, current sore throat, ear pain, nausea, vomiting, abdominal pain, urinary symptoms, abnormal bowel movements.  Patient states that when she coughs, she has chest pain, no pain at rest.  Additionally, patient states she is having vaginal discharge.  She is sexually active with one female partner and they do not use protection.  Discharge has been present for the past week.  Patient smokes cigarettes daily, denies alcohol or drug use.  Documented history of asthma, does not have an inhaler currently, is trying to get her primary care doctor to give her a refill.  HPI  Past Medical History:  Diagnosis Date  . Asthma     Patient Active Problem List   Diagnosis Date Noted  . 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 Implanon removal 07/17/2017    History reviewed. No pertinent surgical history.   OB History   No obstetric history on file.      Home Medications    Prior to Admission medications   Medication Sig Start Date End Date Taking? Authorizing Provider  albuterol (PROVENTIL HFA;VENTOLIN HFA) 108 (90 Base) MCG/ACT inhaler Inhale 2 puffs into the lungs every 6 (six) hours as needed for  wheezing or shortness of breath. 07/20/18   Armandina StammerNwoko, Agnes I, NP  citalopram (CELEXA) 20 MG tablet Take 1 tablet (20 mg total) by mouth daily. For depression 07/21/18   Armandina StammerNwoko, Agnes I, NP  fluticasone (FLONASE) 50 MCG/ACT nasal spray Place 2 sprays into both nostrils daily. 08/23/18   Cathie HoopsYu, Amy V, PA-C  hydrOXYzine (ATARAX/VISTARIL) 25 MG tablet Take 1 tablet (25 mg total) by mouth 3 (three) times daily as needed for anxiety. 07/20/18   Armandina StammerNwoko, Agnes I, NP  traZODone (DESYREL) 50 MG tablet Take 1 tablet (50 mg total) by mouth at bedtime as needed for sleep. 07/20/18   Sanjuana KavaNwoko, Agnes I, NP    Family History No family history on file.  Social History Social History   Tobacco Use  . Smoking status: Passive Smoke Exposure - Never Smoker  . Smokeless tobacco: Never Used  Substance Use Topics  . Alcohol use: No  . Drug use: Yes    Types: Marijuana    Comment: Last used: This AM      Allergies   Penicillins   Review of Systems Review of Systems  HENT: Positive for congestion and sore throat.   Respiratory: Positive for cough and shortness of breath.   Cardiovascular: Positive for chest pain (with cough).  Genitourinary: Positive for vaginal discharge.  All other systems reviewed and are negative.    Physical Exam Updated Vital Signs BP (!) 105/59 (  BP Location: Right Arm)   Pulse 92   Temp 98.1 F (36.7 C) (Oral)   Resp 16   Ht 5\' 3"  (1.6 m)   Wt 77.1 kg   LMP 11/07/2018   SpO2 100%   BMI 30.11 kg/m   Physical Exam Vitals signs and nursing note reviewed. Exam conducted with a chaperone present.  Constitutional:      General: She is not in acute distress.    Appearance: She is well-developed.     Comments: Appears nontoxic  HENT:     Head: Normocephalic and atraumatic.     Comments: OP clear without tonsillar swelling or exudate.  Uvula midline with palate.  TMs nonerythematous and nonbulging bilaterally. Eyes:     Conjunctiva/sclera: Conjunctivae normal.     Pupils:  Pupils are equal, round, and reactive to light.  Neck:     Musculoskeletal: Normal range of motion and neck supple.  Cardiovascular:     Rate and Rhythm: Normal rate and regular rhythm.     Pulses: Normal pulses.  Pulmonary:     Effort: Pulmonary effort is normal. No respiratory distress.     Breath sounds: Normal breath sounds. No wheezing.     Comments: Speaking in full sentences. Clear lung sounds in all fields. TTP of chest wall along sternum and borders Chest:     Chest wall: Tenderness present.  Abdominal:     General: There is no distension.     Palpations: Abdomen is soft. There is no mass.     Tenderness: There is abdominal tenderness. There is no guarding or rebound.       Comments: Mild TTP of suprapubic and LLQ abd/pelvis  Genitourinary:    Exam position: Supine.     Vagina: Normal.     Cervix: Cervical motion tenderness and discharge present.     Uterus: Normal.      Adnexa: Right adnexa normal.       Left: Tenderness present.      Comments: copious thick yellow-white discharge noted on exam. TTP of L adnexa. Mild CMT.  Musculoskeletal: Normal range of motion.  Skin:    General: Skin is warm and dry.     Capillary Refill: Capillary refill takes less than 2 seconds.  Neurological:     Mental Status: She is alert and oriented to person, place, and time.      ED Treatments / Results  Labs (all labs ordered are listed, but only abnormal results are displayed) Labs Reviewed  WET PREP, GENITAL - Abnormal; Notable for the following components:      Result Value   Clue Cells Wet Prep HPF POC PRESENT (*)    WBC, Wet Prep HPF POC MANY (*)    All other components within normal limits  URINALYSIS, ROUTINE W REFLEX MICROSCOPIC - Abnormal; Notable for the following components:   Leukocytes, UA TRACE (*)    All other components within normal limits  RPR  HIV ANTIBODY (ROUTINE TESTING W REFLEX)  I-STAT BETA HCG BLOOD, ED ([facility], [facility] ONLY)  GC/CHLAMYDIA PROBE AMP ([facility]) NOT AT [facility]    EKG EKG Interpretation  Date/Time:  Sunday November 14 2018 08:08:10 EST Ventricular Rate:  76 PR Interval:  128 QRS Duration: 86 QT Interval:  350 QTC Calculation: 393 R Axis:   47 Text Interpretation:  Normal sinus rhythm with sinus arrhythmia Normal ECG No significant change since last tracing Confirmed by Linwood DibblesKnapp, Jon [phone]([id number]) on 11/14/2018 8:14:25 AM   Radiology No  results found.  Procedures Procedures (including critical care time)  Medications Ordered in ED Medications - No data to display   Initial Impression / Assessment and Plan / ED Course  I have reviewed the triage vital signs and the nursing notes.  Pertinent labs & imaging results that were available during my care of the patient were reviewed by me and considered in my medical decision making (see chart for details).     For evaluation of URI symptoms.  Physical exam reassuring, she is afebrile not tachycardic.  Appears nontoxic.  Symptoms mostly seem to be stemming from nasal congestion and pressure.  Symptoms are worse at night, and improve during the day.  As such, consider rhinitis.  Low suspicion for flu.  Pulmonary exam reassuring, doubt pneumonia.  No sign of AOM or strep at this time.  Will treat by refilling patient's home albuterol, encouraged use of her home nasal spray, and use of antihistamine. Additionally, patient having vaginal discharge, as she is also having lower abdominal pain, will perform pelvic exam.  Pelvic exam with discharge, left adnexal tenderness, and CMT.  As such, will obtain pelvic ultrasound to rule out TOA or torsion.  Concern for possible cervicitis, patient is afebrile and appears nontoxic.  As such, low suspicion for PID.   Wet prep positive positive for BV.  Shows many white cells.  UA without obvious infection, shows only trace leuks, no bacteria.  Ultrasound pending.  Patient informed RN that she needs to leave.  I discussed with her that results from her  ultrasound are not returned, and she may still have a medical emergency that needs to be managed.  Patient states she understands, but will follow up with her PCP regarding results.  Discussed results are gonorrhea, chlamydia, HIV, and syphilis are pending.  As patient has an anaphylactic allergy to penicillin and has never received Rocephin or other cephalosporins, will hold off on Rocephin shot.  Doxy given for cervicitis.  Discussed with patient that results are pending, and if positive she will need to follow-up with her primary care doctor or health department for treatment.   On follow-up with ultrasound after discharge, patient was found to have 2 left ovarian cyst, 1 of which might be complex.  As I do not believe she needs to come back into the hospital for this emergently, will have patient follow-up with her PCP regarding results.  Final Clinical Impressions(s) / ED Diagnoses   Final diagnoses:  Rhinitis, unspecified type  Vaginal discharge  Cervicitis  BV (bacterial vaginosis)    ED Discharge Orders    None       Alveria Apley, PA-C 11/14/18 1213    Gwyneth Sprout, MD 11/14/18 1521

## 2018-11-14 NOTE — ED Notes (Signed)
Pt was referring to her asthma and not chest pain until later. EKG - after 10 minutes.

## 2018-11-14 NOTE — Discharge Instructions (Addendum)
You left today prior to having results of your ultrasound.  It is important that you follow-up with your primary care doctor about these results. Your wet prep was positive for bacterial vaginosis.  Take Flagyl twice a day as prescribed for the next week for this.  Do not drink alcohol while taking this medication. Additionally, you had tenderness on your pelvic exam.  As such, we are treating for cervicitis with doxycycline. Testing for gonorrhea, chlamydia, HIV, and syphilis were sent.  If positive, you will receive a phone call.  If positive, you will need to follow up for treatment.  You may do this at the health department or with your primary care doctor. Your nasal congestion, cough at night, and dry throat is likely related to a viral illness.  This should be treated symptomatically.  Use Zyrtec daily to help with nasal congestion and mucus production.  Use albuterol as needed for shortness of breath or chest tightness. Return to the emergency room with any new, worsening, concerning symptoms.

## 2018-11-15 LAB — GC/CHLAMYDIA PROBE AMP (~~LOC~~) NOT AT ARMC
Chlamydia: NEGATIVE
NEISSERIA GONORRHEA: NEGATIVE

## 2019-06-05 ENCOUNTER — Other Ambulatory Visit: Payer: Self-pay

## 2019-06-05 ENCOUNTER — Ambulatory Visit (INDEPENDENT_AMBULATORY_CARE_PROVIDER_SITE_OTHER): Payer: 59

## 2019-06-05 ENCOUNTER — Ambulatory Visit (HOSPITAL_COMMUNITY)
Admission: EM | Admit: 2019-06-05 | Discharge: 2019-06-05 | Disposition: A | Discharge status: Home / Self Care | Payer: 59 | Attending: Family Medicine | Admitting: Family Medicine

## 2019-06-05 ENCOUNTER — Encounter (HOSPITAL_COMMUNITY): Payer: Self-pay | Admitting: *Deleted

## 2019-06-05 DIAGNOSIS — M79641 Pain in right hand: Secondary | ICD-10-CM

## 2019-06-05 DIAGNOSIS — M546 Pain in thoracic spine: Secondary | ICD-10-CM

## 2019-06-05 DIAGNOSIS — W2209XA Striking against other stationary object, initial encounter: Secondary | ICD-10-CM

## 2019-06-05 HISTORY — DX: Anxiety disorder, unspecified: F41.9
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
## 2019-06-05 NOTE — ED Provider Notes (Signed)
[place]    CSN: [id number] Arrival date & time: 06/05/19  1054      History   Chief Complaint Chief Complaint  Patient presents with  . Hand Injury  . Back Pain    HPI Misty Wood is a 23 y.o. female.   She is presenting with right hand pain and back pain.  She punched a wall and had pain after that.  She puts it on Wednesday of last week.  The pain and swelling is been intermittent since that has been ongoing.  She is experiencing pain and swelling at the fifth metacarpal head.  Also having pain that is periscapular in nature on the right side of the back.  Has not taken anything for the pain.  The pain seems to be ongoing.  Is intermittent in nature.  Pain in the back is worse with certain movements such as raising her arms above her head.  HPI  Past Medical History:  Diagnosis Date  . Anxiety   . Asthma     Patient Active Problem List   Diagnosis Date Noted  . Severe recurrent major depression without psychotic features ([place]) 07/15/2018  . Implanon removal 07/17/2017    History reviewed. No pertinent surgical history.  OB History   No obstetric history on file.      Home Medications    Prior to Admission medications   Medication Sig Start Date End Date Taking? Authorizing Provider  citalopram (CELEXA) 20 MG tablet Take 1 tablet (20 mg total) by mouth daily. For depression 07/21/18  Yes Lindell Spar I, NP  hydrOXYzine (ATARAX/VISTARIL) 25 MG tablet Take 1 tablet (25 mg total) by mouth 3 (three) times daily as needed for anxiety. 07/20/18  Yes Lindell Spar I, NP  albuterol (PROVENTIL HFA;VENTOLIN HFA) 108 (90 Base) MCG/ACT inhaler Inhale 2 puffs into the lungs every 6 (six) hours as needed for wheezing or shortness of breath. 11/14/18   Caccavale, Sophia, PA-C  cetirizine (ZYRTEC) 10 MG tablet Take 1 tablet (10 mg total) by mouth daily. 11/14/18   Caccavale, Sophia, PA-C  fluticasone (FLONASE) 50 MCG/ACT nasal spray Place 2 sprays into both nostrils  daily. 08/23/18   Tasia Catchings, Amy V, PA-C  metroNIDAZOLE (FLAGYL) 500 MG tablet Take 1 tablet (500 mg total) by mouth 2 (two) times daily. 11/14/18   Caccavale, Sophia, PA-C  traZODone (DESYREL) 50 MG tablet Take 1 tablet (50 mg total) by mouth at bedtime as needed for sleep. 07/20/18   Encarnacion Slates, NP    Family History Family History  Problem Relation Age of Onset  . Asthma Mother     Social History Social History   Tobacco Use  . Smoking status: Never Smoker  . Smokeless tobacco: Never Used  Substance Use Topics  . Alcohol use: Yes    Comment: occasionally  . Drug use: Yes    Types: Marijuana     Allergies   Penicillins   Review of Systems Review of Systems  Constitutional: Negative for fever.  HENT: Negative for congestion.   Respiratory: Negative for cough.   Cardiovascular: Negative for chest pain.  Gastrointestinal: Negative for abdominal pain.  Musculoskeletal: Positive for back pain.  Skin: Negative for color change.  Neurological: Negative for weakness.  Hematological: Negative for adenopathy.     Physical Exam Triage Vital Signs ED Triage Vitals  Enc Vitals Group     BP 06/05/19 1113 103/64     Pulse Rate 06/05/19 1113 93  Resp 06/05/19 1113 16     Temp 06/05/19 1113 98.1 F (36.7 C)     Temp Source 06/05/19 1113 Other     SpO2 06/05/19 1113 98 %     Weight --      Height --      Head Circumference --      Peak Flow --      Pain Score 06/05/19 1114 9     Pain Loc --      Pain Edu? --      Excl. in GC? --    No data found.  Updated Vital Signs BP 103/64   Pulse 93   Temp 98.1 F (36.7 C) (Other (Comment))   Resp 16   LMP 05/13/2019 (Exact Date)   SpO2 98%   Visual Acuity Right Eye Distance:   Left Eye Distance:   Bilateral Distance:    Right Eye Near:   Left Eye Near:    Bilateral Near:     Physical Exam Gen: NAD, alert, cooperative with exam, well-appearing ENT: normal lips, normal nasal mucosa,  Eye: normal EOM, normal  conjunctiva and lids CV:  no edema, +2 pedal pulses   Resp: no accessory muscle use, non-labored,  GI: no masses or tenderness, no hernia  Skin: no rashes, no areas of induration  Neuro: normal tone, normal sensation to touch Psych:  normal insight, alert and oriented MSK:  Right hand: Swelling and ecchymosis at the head of the fifth metacarpal on the ulnar side.  Tenderness to palpation over this area. No malrotation or misalignment. Normal strength resistance with finger abduction abduction. Normal wrist range of motion. Back: Symptoms to palpation over the medial aspect of the scapula. No winging of the scapula. Normal shoulder flexion abduction. Neurovascular intact  UC Treatments / Results  Labs (all labs ordered are listed, but only abnormal results are displayed) Labs Reviewed - No data to display  EKG   Radiology Dg Hand Complete Right  Result Date: 06/05/2019 CLINICAL DATA:  Blunt trauma with pain and swelling in the right hand. EXAM: RIGHT HAND - COMPLETE 3+ VIEW COMPARISON:  None. FINDINGS: Hypo thenar right hand soft tissue swelling. No fracture or dislocation. No suspicious focal osseous lesion. No significant arthropathy. No radiopaque foreign body. IMPRESSION: Hypo thenar right hand soft tissue swelling, with no fracture or malalignment. Electronically Signed   By: Delbert PhenixJason A Poff M.D.   On: 06/05/2019 11:45    Procedures Procedures (including critical care time)  Medications Ordered in UC Medications - No data to display  Initial Impression / Assessment and Plan / UC Course  I have reviewed the triage vital signs and the nursing notes.  Pertinent labs & imaging results that were available during my care of the patient were reviewed by me and considered in my medical decision making (see chart for details).     Misty Wood is a 23 year old female that is presenting with right hand pain and back pain.  Imaging was not conclusive for fracture of the metacarpal  shaft.  Given indications to follow-up with pain is still ongoing as fracture may not be appreciated.  Back pain is likely strain.  Counseled on supportive care and home exercise program.  Given indications to follow-up and return.  Final Clinical Impressions(s) / UC Diagnoses   Final diagnoses:  Acute right-sided thoracic back pain  Right hand pain     Discharge Instructions     Please use ice  Please try ibuprofen  Please follow up if  your symptoms continue     ED Prescriptions    None     Controlled Substance Prescriptions [place] Controlled Substance Registry consulted? Not Applicable   Myra Rude, MD 06/05/19 [phone]

## 2019-06-05 NOTE — Discharge Instructions (Signed)
Please use ice  Please try ibuprofen  Please follow up if your symptoms continue

## 2019-07-18 ENCOUNTER — Emergency Department (HOSPITAL_COMMUNITY)
Admission: EM | Admit: 2019-07-18 | Discharge: 2019-07-18 | Disposition: A | Discharge status: Home / Self Care | Payer: Medicaid Other | Attending: Emergency Medicine | Admitting: Emergency Medicine

## 2019-07-18 ENCOUNTER — Other Ambulatory Visit: Payer: Self-pay

## 2019-07-18 ENCOUNTER — Encounter (HOSPITAL_COMMUNITY): Payer: Self-pay | Admitting: Emergency Medicine

## 2019-07-18 DIAGNOSIS — M7918 Myalgia, other site: Secondary | ICD-10-CM | POA: Insufficient documentation

## 2019-07-18 DIAGNOSIS — Z5321 Procedure and treatment not carried out due to patient leaving prior to being seen by health care provider: Secondary | ICD-10-CM | POA: Insufficient documentation

## 2019-07-18 LAB — COMPREHENSIVE METABOLIC PANEL
ALT: 39 U/L (ref 0–44)
AST: 23 U/L (ref 15–41)
Albumin: 4.1 g/dL (ref 3.5–5.0)
Alkaline Phosphatase: 52 U/L (ref 38–126)
Anion gap: 10 (ref 5–15)
BUN: 7 mg/dL (ref 6–20)
CO2: 21 mmol/L — ABNORMAL LOW (ref 22–32)
Calcium: 9.1 mg/dL (ref 8.9–10.3)
Chloride: 106 mmol/L (ref 98–111)
Creatinine, Ser: 0.68 mg/dL (ref 0.44–1.00)
GFR calc Af Amer: >60 mL/min (ref >60)
GFR calc non Af Amer: >60 mL/min (ref >60)
Glucose, Bld: 88 mg/dL (ref 70–99)
Potassium: 3.9 mmol/L (ref 3.5–5.1)
Sodium: 137 mmol/L (ref 135–145)
Total Bilirubin: 0.7 mg/dL (ref 0.3–1.2)
Total Protein: 7.4 g/dL (ref 6.5–8.1)

## 2019-07-18 LAB — CBC
HCT: 35.6 % — ABNORMAL LOW (ref 36.0–46.0)
Hemoglobin: 11.4 g/dL — ABNORMAL LOW (ref 12.0–15.0)
MCH: 30.6 pg (ref 26.0–34.0)
MCHC: 32 g/dL (ref 30.0–36.0)
MCV: 95.7 fL (ref 80.0–100.0)
Platelets: 181 10*3/uL (ref 150–400)
RBC: 3.72 MIL/uL — ABNORMAL LOW (ref 3.87–5.11)
RDW: 14.5 % (ref 11.5–15.5)
WBC: 5.3 10*3/uL (ref 4.0–10.5)
nRBC: 0 % (ref 0.0–0.2)

## 2019-07-18 LAB — I-STAT BETA HCG BLOOD, ED (MC, WL, AP ONLY): I-stat hCG, quantitative: <5 m[IU]/mL (ref <5)

## 2019-07-18 LAB — ETHANOL: Alcohol, Ethyl (B): <10 mg/dL (ref <10)

## 2019-07-18 LAB — ACETAMINOPHEN LEVEL: Acetaminophen (Tylenol), Serum: <10 ug/mL — ABNORMAL LOW (ref 10–30)

## 2019-07-18 LAB — SALICYLATE LEVEL: Salicylate Lvl: <7 mg/dL (ref 2.8–30.0)

## 2019-07-18 NOTE — ED Notes (Signed)
TTS called for pt assessment, unable to locate pt at this time.

## 2019-07-18 NOTE — ED Triage Notes (Signed)
Patient states she would like a mental health evaluation. Reports feeling hopeless and down. States she does not have any plans of harming herself. Patients is supposed to be on medication for depression and anxiety but out of medication. C/o generalized pain due to sleeping on the floor.

## 2019-10-20 ENCOUNTER — Ambulatory Visit (HOSPITAL_COMMUNITY)
Admission: RE | Admit: 2019-10-20 | Discharge: 2019-10-20 | Disposition: A | Discharge status: Home / Self Care | Payer: Managed Care, Other (non HMO) | Attending: Psychiatry | Admitting: Psychiatry

## 2019-10-20 DIAGNOSIS — F419 Anxiety disorder, unspecified: Secondary | ICD-10-CM | POA: Insufficient documentation

## 2019-10-20 DIAGNOSIS — F1290 Cannabis use, unspecified, uncomplicated: Secondary | ICD-10-CM | POA: Insufficient documentation

## 2019-10-20 DIAGNOSIS — J45909 Unspecified asthma, uncomplicated: Secondary | ICD-10-CM | POA: Insufficient documentation

## 2019-10-20 DIAGNOSIS — Z91410 Personal history of adult physical and sexual abuse: Secondary | ICD-10-CM | POA: Insufficient documentation

## 2019-10-20 DIAGNOSIS — Z7289 Other problems related to lifestyle: Secondary | ICD-10-CM | POA: Insufficient documentation

## 2019-10-20 DIAGNOSIS — Z880 Allergy status to penicillin: Secondary | ICD-10-CM | POA: Diagnosis not present

## 2019-10-20 DIAGNOSIS — F332 Major depressive disorder, recurrent severe without psychotic features: Secondary | ICD-10-CM | POA: Diagnosis present

## 2019-10-20 DIAGNOSIS — R45851 Suicidal ideations: Secondary | ICD-10-CM | POA: Diagnosis not present
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
## 2019-10-21 ENCOUNTER — Telehealth (HOSPITAL_COMMUNITY): Payer: Self-pay | Admitting: Psychiatry

## 2019-10-21 NOTE — Telephone Encounter (Signed)
D:  Misty Wood (TTS) referred pt to [facility].  A:  Placed call to orient and provide pt with a start date, but the number has been changed or disconnected according to the [employer] recording.  Inform Misty.

## 2019-10-24 ENCOUNTER — Telehealth (HOSPITAL_COMMUNITY): Payer: Self-pay | Admitting: Psychiatry

## 2019-10-24 ENCOUNTER — Other Ambulatory Visit (HOSPITAL_COMMUNITY): Payer: Managed Care, Other (non HMO) | Admitting: Psychiatry

## 2019-10-24 ENCOUNTER — Other Ambulatory Visit: Payer: Self-pay

## 2019-10-24 NOTE — Telephone Encounter (Signed)
D:  Pt states she would like to contact her insurance co before making a decision re: attending [facility] or not; d/t financial difficulties.  A:  Will wait before completing CCA with pt. Case mgr will await to hear back from pt.

## 2019-12-13 IMAGING — DX RIGHT HAND - COMPLETE 3+ VIEW
3 series · 3 of 3 positions shown · non-contrast
Comparison: None.

CLINICAL DATA: Blunt trauma with pain and swelling in the right
hand.

EXAM:
RIGHT HAND - COMPLETE 3+ VIEW

[hand pa]
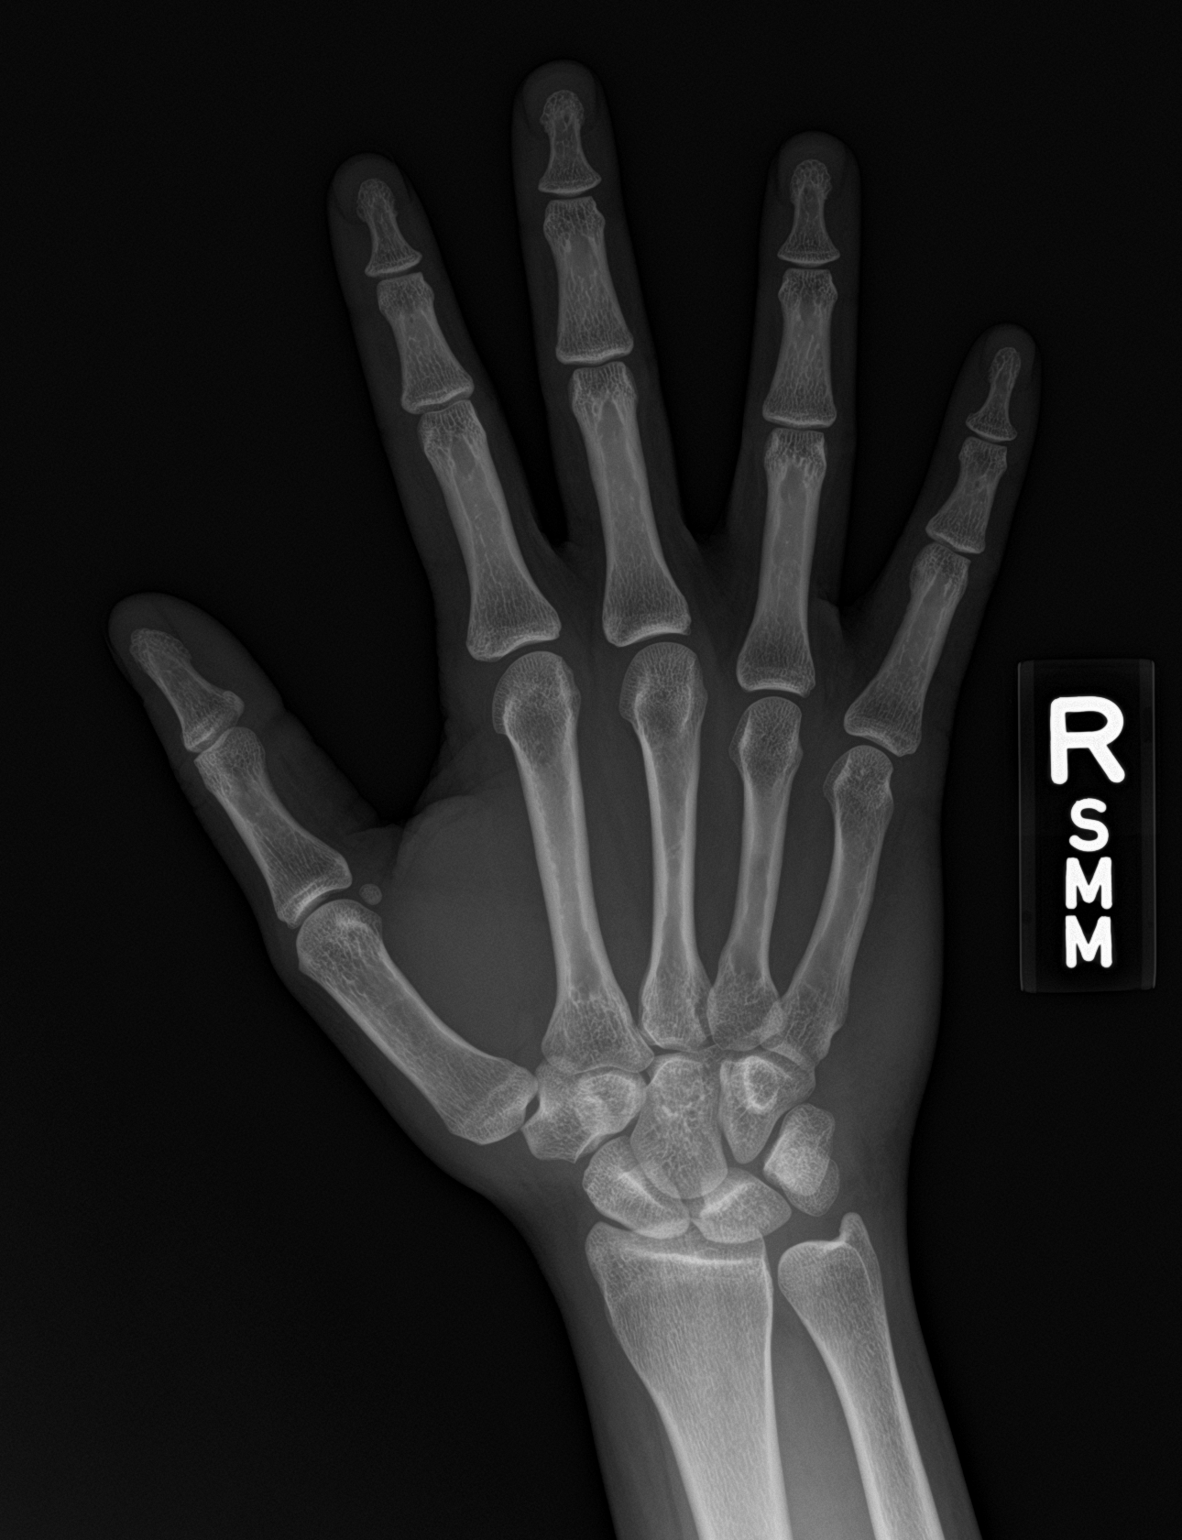

[hand obl]
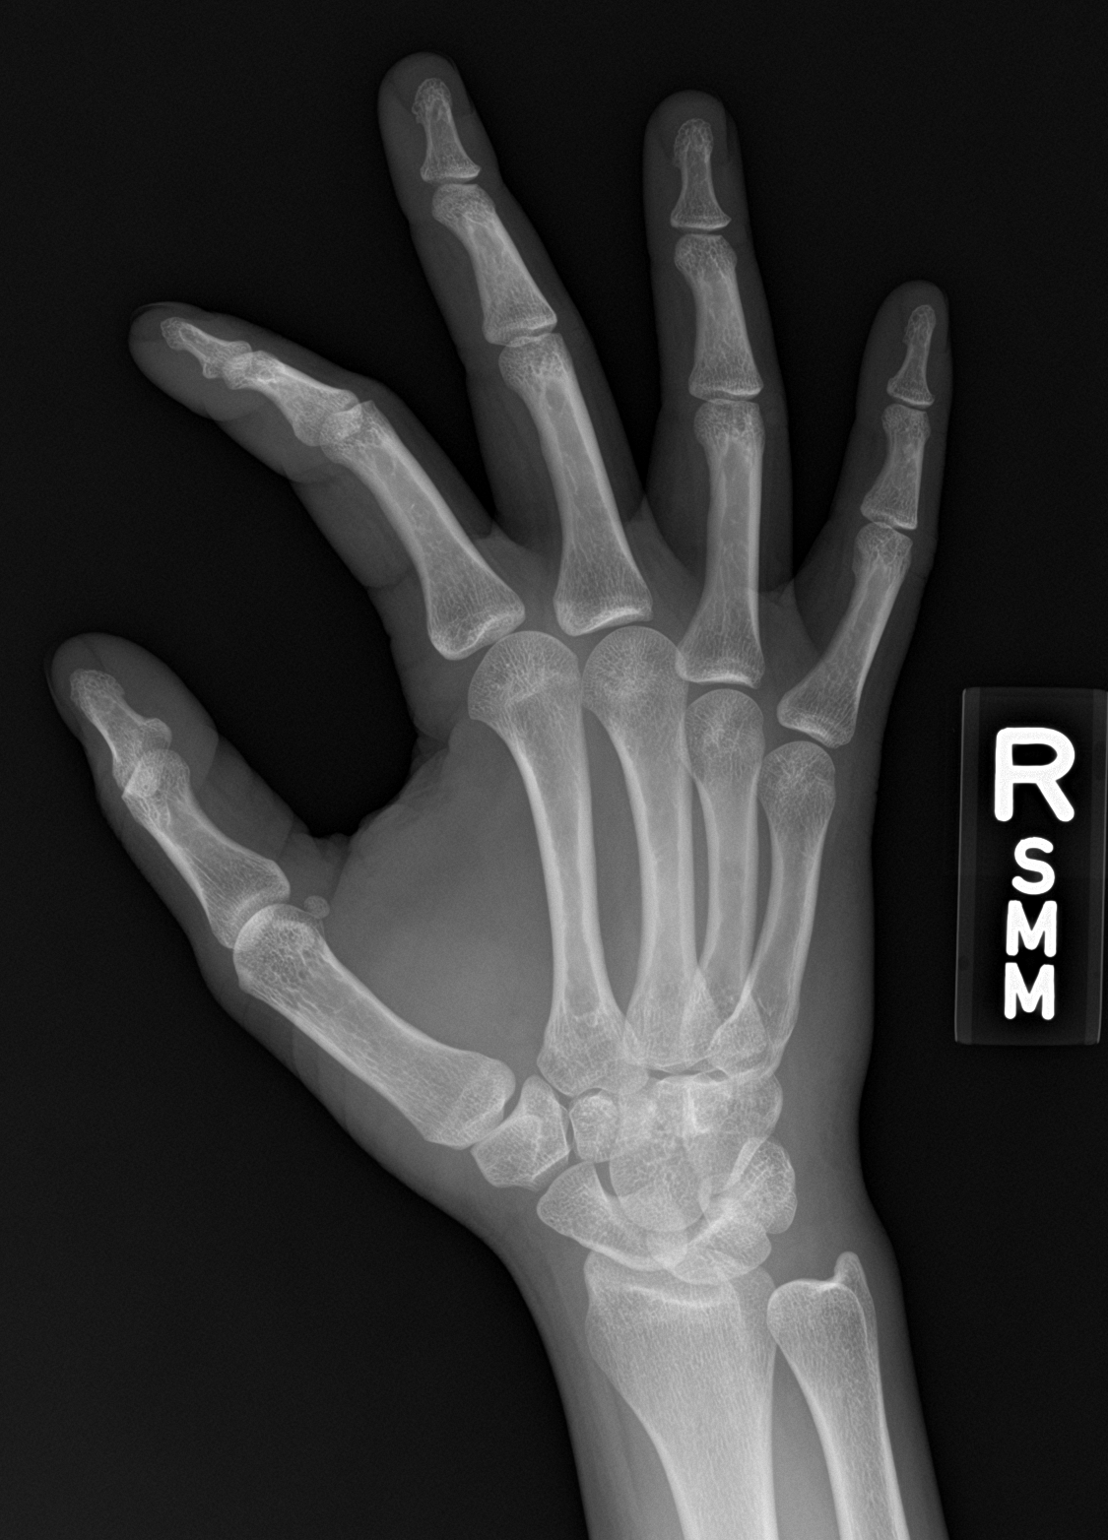

[hand lat]
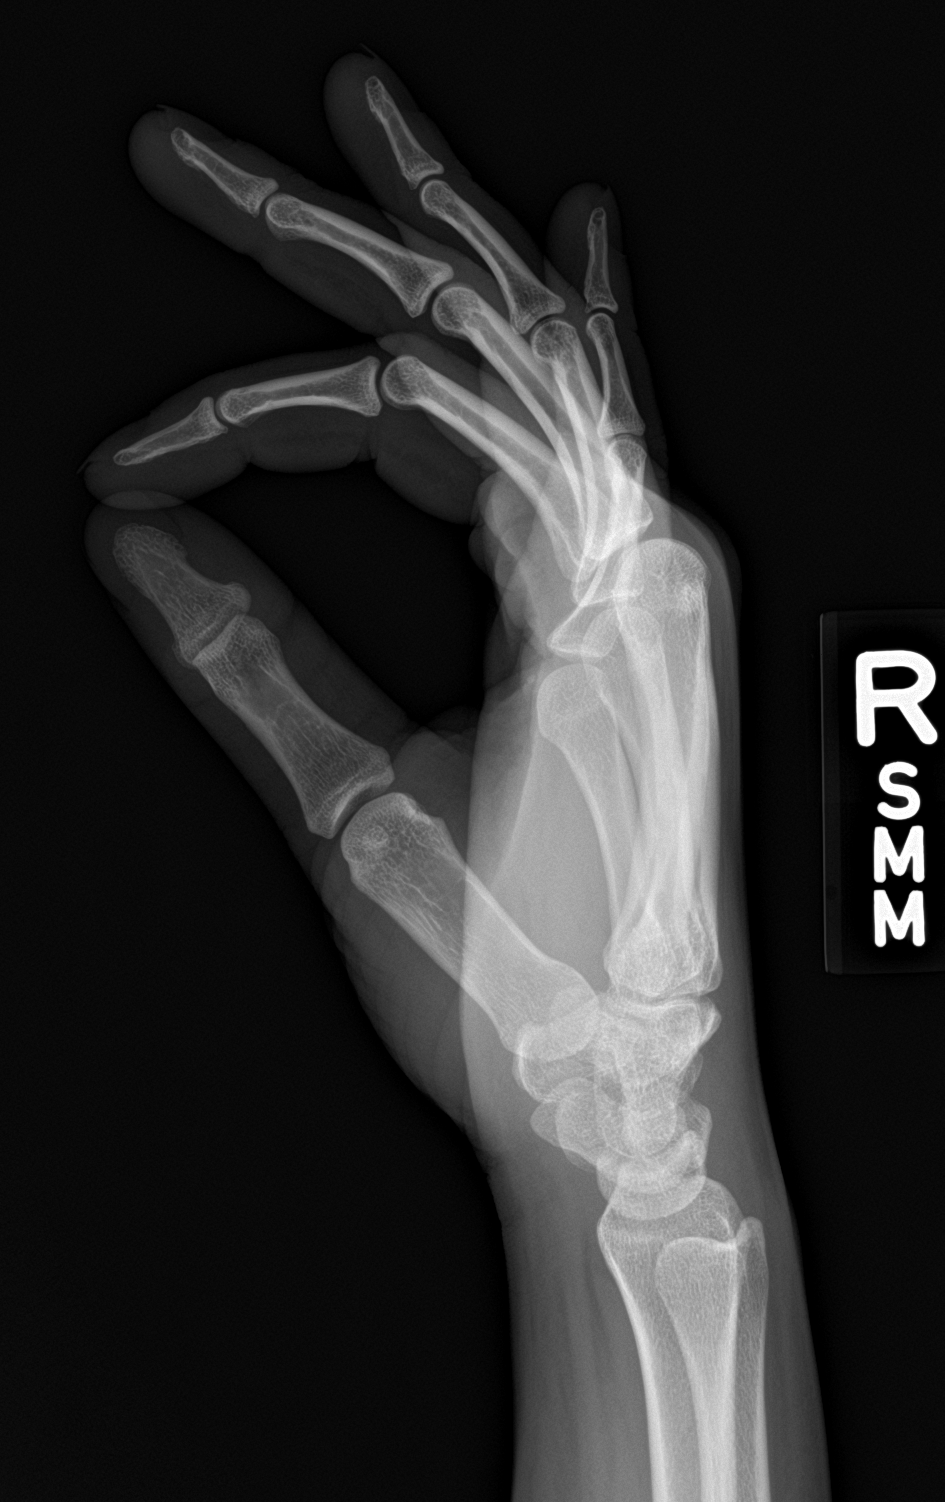

[3 of 3 positions shown; findings below may reference images not displayed]

FINDINGS: Hypo thenar right hand soft tissue swelling. No fracture or
dislocation. No suspicious focal osseous lesion. No significant
arthropathy. No radiopaque foreign body.
IMPRESSION: Hypo thenar right hand soft tissue swelling, with no fracture or
malalignment.

## 2021-08-22 DIAGNOSIS — G8929 Other chronic pain: Secondary | ICD-10-CM | POA: Insufficient documentation

## 2021-08-22 DIAGNOSIS — N946 Dysmenorrhea, unspecified: Secondary | ICD-10-CM | POA: Insufficient documentation

## 2021-09-06 LAB — HM PAP SMEAR

## 2021-10-06 NOTE — L&D Delivery Note (Signed)
Delivery Note 26 y.o. G1P0000 at [redacted]w[redacted]d admitted for pre-eclampsia and severe FGR; started on IOL today due to concerns for abruption and severe features.  At 4:10 AM a viable female was delivered via Vaginal, Spontaneous (Presentation:   Occiput Anterior).  APGAR: 2 in 1 min, 6 in 5 mins, 8 at 10 mins ; weight  TBD.   Placenta status: Spontaneous;Pathology, Intact.  Cord: 3 vessels with the following complications: None.  Cord pH: collected.  Cord was clamped and cut immediately and transferred to waiting neonatology team for resuscitation. TXA administered following delivery of the baby, due to thrombocytopenia.  Anesthesia: Epidural Episiotomy: None Lacerations: None Suture Repair:  none Est. Blood Loss (mL): 77  Mom to postpartum.  Baby to NICU.  Dr Despina Hidden was present for delivery.  Sheppard Evens MD MPH OB Fellow, Faculty Practice [facility], [facility] 09/18/2022

## 2022-02-17 ENCOUNTER — Ambulatory Visit (HOSPITAL_COMMUNITY)
Admission: EM | Admit: 2022-02-17 | Discharge: 2022-02-18 | Disposition: A | Discharge status: Home / Self Care | Payer: No Payment, Other | Attending: Psychiatry | Admitting: Psychiatry

## 2022-02-17 DIAGNOSIS — Z79899 Other long term (current) drug therapy: Secondary | ICD-10-CM | POA: Insufficient documentation

## 2022-02-17 DIAGNOSIS — R45851 Suicidal ideations: Secondary | ICD-10-CM | POA: Insufficient documentation

## 2022-02-17 DIAGNOSIS — F332 Major depressive disorder, recurrent severe without psychotic features: Secondary | ICD-10-CM | POA: Insufficient documentation

## 2022-02-17 DIAGNOSIS — Z20822 Contact with and (suspected) exposure to covid-19: Secondary | ICD-10-CM | POA: Insufficient documentation

## 2022-02-17 DIAGNOSIS — F419 Anxiety disorder, unspecified: Secondary | ICD-10-CM | POA: Insufficient documentation

## 2022-02-17 LAB — URINALYSIS, COMPLETE (UACMP) WITH MICROSCOPIC
Bacteria, UA: NONE SEEN
Bilirubin Urine: NEGATIVE
Glucose, UA: NEGATIVE mg/dL
Ketones, ur: 5 mg/dL — AB
Leukocytes,Ua: NEGATIVE
Nitrite: NEGATIVE
Protein, ur: NEGATIVE mg/dL
Specific Gravity, Urine: 1.013 (ref 1.005–1.030)
pH: 5 (ref 5.0–8.0)

## 2022-02-17 LAB — ETHANOL: Alcohol, Ethyl (B): <10 mg/dL (ref <10)

## 2022-02-17 LAB — POCT URINE DRUG SCREEN - MANUAL ENTRY (I-SCREEN)
POC Amphetamine UR: NOT DETECTED
POC Buprenorphine (BUP): NOT DETECTED
POC Cocaine UR: NOT DETECTED
POC Marijuana UR: POSITIVE — AB
POC Methadone UR: NOT DETECTED
POC Methamphetamine UR: NOT DETECTED
POC Morphine: NOT DETECTED
POC Oxazepam (BZO): NOT DETECTED
POC Oxycodone UR: NOT DETECTED
POC Secobarbital (BAR): NOT DETECTED

## 2022-02-17 LAB — CBC WITH DIFFERENTIAL/PLATELET
Abs Immature Granulocytes: 0.02 10*3/uL (ref 0.00–0.07)
Basophils Absolute: 0.1 10*3/uL (ref 0.0–0.1)
Basophils Relative: 1 %
Eosinophils Absolute: 0.4 10*3/uL (ref 0.0–0.5)
Eosinophils Relative: 5 %
HCT: 35.9 % — ABNORMAL LOW (ref 36.0–46.0)
Hemoglobin: 12.2 g/dL (ref 12.0–15.0)
Immature Granulocytes: 0 %
Lymphocytes Relative: 39 %
Lymphs Abs: 3 10*3/uL (ref 0.7–4.0)
MCH: 31 pg (ref 26.0–34.0)
MCHC: 34 g/dL (ref 30.0–36.0)
MCV: 91.1 fL (ref 80.0–100.0)
Monocytes Absolute: 0.5 10*3/uL (ref 0.1–1.0)
Monocytes Relative: 7 %
Neutro Abs: 3.7 10*3/uL (ref 1.7–7.7)
Neutrophils Relative %: 48 %
Platelets: 243 10*3/uL (ref 150–400)
RBC: 3.94 MIL/uL (ref 3.87–5.11)
RDW: 14.2 % (ref 11.5–15.5)
WBC: 7.7 10*3/uL (ref 4.0–10.5)
nRBC: 0 % (ref 0.0–0.2)

## 2022-02-17 LAB — COMPREHENSIVE METABOLIC PANEL
ALT: 25 U/L (ref 0–44)
AST: 21 U/L (ref 15–41)
Albumin: 3.9 g/dL (ref 3.5–5.0)
Alkaline Phosphatase: 45 U/L (ref 38–126)
Anion gap: 7 (ref 5–15)
BUN: 17 mg/dL (ref 6–20)
CO2: 21 mmol/L — ABNORMAL LOW (ref 22–32)
Calcium: 9.4 mg/dL (ref 8.9–10.3)
Chloride: 106 mmol/L (ref 98–111)
Creatinine, Ser: 0.83 mg/dL (ref 0.44–1.00)
GFR, Estimated: >60 mL/min (ref >60)
Glucose, Bld: 107 mg/dL — ABNORMAL HIGH (ref 70–99)
Potassium: 4.4 mmol/L (ref 3.5–5.1)
Sodium: 134 mmol/L — ABNORMAL LOW (ref 135–145)
Total Bilirubin: 0.7 mg/dL (ref 0.3–1.2)
Total Protein: 6.8 g/dL (ref 6.5–8.1)

## 2022-02-17 LAB — PREGNANCY, URINE: Preg Test, Ur: NEGATIVE

## 2022-02-17 LAB — RESP PANEL BY RT-PCR (FLU A&B, COVID) ARPGX2
Influenza A by PCR: NEGATIVE
Influenza B by PCR: NEGATIVE
SARS Coronavirus 2 by RT PCR: NEGATIVE

## 2022-02-17 LAB — LIPID PANEL
Cholesterol: 198 mg/dL (ref 0–200)
HDL: 70 mg/dL (ref >40)
LDL Cholesterol: 111 mg/dL — ABNORMAL HIGH (ref 0–99)
Total CHOL/HDL Ratio: 2.8 RATIO
Triglycerides: 85 mg/dL (ref <150)
VLDL: 17 mg/dL (ref 0–40)

## 2022-02-17 LAB — HEMOGLOBIN A1C
Hgb A1c MFr Bld: 5.4 % (ref 4.8–5.6)
Mean Plasma Glucose: 108.28 mg/dL

## 2022-02-17 LAB — MAGNESIUM: Magnesium: 1.8 mg/dL (ref 1.7–2.4)

## 2022-02-17 LAB — POC SARS CORONAVIRUS 2 AG -  ED: SARS Coronavirus 2 Ag: NEGATIVE

## 2022-02-17 LAB — TSH: TSH: 1.28 u[IU]/mL (ref 0.350–4.500)

## 2022-02-17 LAB — POCT PREGNANCY, URINE: Preg Test, Ur: NEGATIVE

## 2022-02-17 MED ORDER — ACETAMINOPHEN 325 MG PO TABS: 650.0000 mg | ORAL_TABLET | Freq: Four times a day (QID) | ORAL | Status: DC | PRN

## 2022-02-17 MED ORDER — ALUM & MAG HYDROXIDE-SIMETH 200-200-20 MG/5ML PO SUSP: 30.0000 mL | ORAL | Status: DC | PRN

## 2022-02-17 MED ORDER — TRAZODONE HCL 50 MG PO TABS
50.0000 mg | ORAL_TABLET | Freq: Every evening | ORAL | Status: DC | PRN
  Administered 2022-02-17: 50 mg via ORAL
  Filled 2022-02-17: qty 1
  Filled 2022-02-17: qty 7

## 2022-02-17 MED ORDER — MAGNESIUM HYDROXIDE 400 MG/5ML PO SUSP: 30.0000 mL | Freq: Every day | ORAL | Status: DC | PRN

## 2022-02-17 MED ORDER — HYDROXYZINE HCL 25 MG PO TABS
25.0000 mg | ORAL_TABLET | Freq: Three times a day (TID) | ORAL | Status: DC | PRN
  Administered 2022-02-17 – 2022-02-18 (×3): 25 mg via ORAL
  Filled 2022-02-17: qty 1
  Filled 2022-02-17: qty 10
  Filled 2022-02-17 (×2): qty 1

## 2022-02-17 MED ORDER — CITALOPRAM HYDROBROMIDE 20 MG PO TABS
20.0000 mg | ORAL_TABLET | Freq: Every day | ORAL | Status: DC
  Administered 2022-02-17: 20 mg via ORAL
  Filled 2022-02-17: qty 1
  Filled 2022-02-17: qty 7

## 2022-02-17 NOTE — ED Notes (Signed)
Initially patient was hostile and resistant to admission process.  She was sitting in intake room on the floor in the corner with the lights off crying.  Patient eventually allowed staff to complete admission process.  Patient admitted to obs due to increased depression and suicidal ideation .  She was demanding staff secure her car.  Patient had plan to overdose on medication.  Patient came onto unit and is tearful but quiet and cooperative with care.  She is now soft spoken but responding appropriately to staff.  Will monitor and provide a safe environment. ?
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
## 2022-02-17 NOTE — ED Notes (Signed)
Patient is quiet and crying. She was cooperative with all testing and blood draw. We were not able to draw blood but patient is very dehydrated. We will attempt later. Patient is despondent about  a potential job where she could work from home. She was prepared to start training this morning for the new work from home job but was told last minute that her computer did not meet the requirement for  the job. She said this "was the cherry on top", "I can't take it any more" , "I have bills to pay, I need a job, can't find a job, my mother needs help." "I only have $5.00" . " I don't see any reason to continue living" "What's the point?".  Patient was able to drink some juice but refused any food. Will continue to monitor for safety.  ?
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
## 2022-02-17 NOTE — ED Notes (Signed)
Pt was given dinner. 

## 2022-02-17 NOTE — ED Notes (Signed)
Pt A&O x 4, resting at present, sad and guarded, Calm & cooperative, no distress noted, monitoring for safety. ?
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
## 2022-02-17 NOTE — ED Notes (Signed)
Pt sleeping at present, no distress noted. Respirations even & unlabored.  Monitoring for safety. 

## 2022-02-18 ENCOUNTER — Telehealth (HOSPITAL_COMMUNITY): Payer: Self-pay | Admitting: Licensed Clinical Social Worker

## 2022-02-18 LAB — RPR: RPR Ser Ql: NONREACTIVE

## 2022-02-18 LAB — GC/CHLAMYDIA PROBE AMP (~~LOC~~) NOT AT ARMC
Chlamydia: NEGATIVE
Comment: NEGATIVE
Comment: NORMAL
Neisseria Gonorrhea: NEGATIVE

## 2022-02-18 MED ORDER — CITALOPRAM HYDROBROMIDE 20 MG PO TABS
20.0000 mg | ORAL_TABLET | Freq: Every day | ORAL | 0 refills | Status: DC
Start: 2022-02-18 — End: 2022-09-02

## 2022-02-18 MED ORDER — HYDROXYZINE HCL 25 MG PO TABS: 25.0000 mg | ORAL_TABLET | Freq: Three times a day (TID) | ORAL | 0 refills | Status: DC | PRN

## 2022-02-18 MED ORDER — TRAZODONE HCL 50 MG PO TABS
50.0000 mg | ORAL_TABLET | Freq: Every evening | ORAL | 0 refills | Status: DC | PRN
Start: 2022-02-18 — End: 2022-08-14

## 2022-02-18 NOTE — ED Provider Notes (Signed)
FBC/OBS ASAP Discharge Summary ? ?Date and Time: 02/18/2022 8:23 AM  ?Name: Misty Wood  ?MRN:  [id number]  ? ?Discharge Diagnoses:  ?Final diagnoses:  ?Severe recurrent major depression without psychotic features ([place])  ? ? ?Subjective: Misty Wood 26 y.o., female patient who initially presented to [place] voluntarily via [employer] after her friend called police with concerns for patient's safety.  She was admitted to the continuous assessment unit for overnight observation. ? ?Misty Wood, 26 y.o., female patient seen face to face by this provider, consulted with Dr. Serafina Mitchell; and chart reviewed on 02/18/22.  Per chart review patient has a history of one IP admission at [facility] 07/2018 for MDD and SI.  She currently has no psychiatric services in place. She is not prescribed any medications at this time. Per patient she has a court date scheduled for 03/19/2022 for destruction of property.  On admission UDS positive for marijuana and BAL with negative. ? ?On today's evaluation Misty Wood is observed laying in her bed.  She is awake and escorted to an assessment room.  She is alert/oriented x4 and makes good eye contact.  Her speech and behavior are normal.  Reports she feels better today.  States yesterday was just very overwhelming because she was unable to start a job that she thought she had.  She continues to endorse depression but states she understands that her circumstances have not changed but she feels more able to deal and cope with her situation.  Reports she was unable to sleep because the unit was loud.  She denies suicidal ideations.  She denies any plan, intent, or access to means.  She contracts for safety.  Discussed suicide prevention/intervention.  She would not allow this writer to contact any collateral.  She denies HI/AVH.  She is able to converse coherently, goal directed thoughts, no distractibility, or preoccupation.  She remained calm and was able to answer all questions  appropriately ? ?Stay Summary:  ? ?Misty Wood was admitted to [facility] Continuous Assessment unit for increased depression, SI and crisis management.  She was treated with the following medications Celexa 20 mg daily, trazodone 50 mg as needed nightly, and hydroxyzine 25 mg 3 times daily as needed for anxiety, which were tolerated with no adverse reactions.   Misty Wood was discharged with current medication and was instructed on how to take medications as prescribed; (details listed below under Medication List).  She was provided with a 7-day sample of medications and a printed prescription for 1 month supply. ? ?Krishauna Ramon Bettendorf's improvement was monitored by continuous assessment/observation and her report of symptom reduction.  Her emotional and mental status was also monitored by staff.  She remained calm and cooperative while on the unit.  She interacted with patients and staff appropriately.  She exhibited no unsafe behaviors. ? ?Discussed outpatient psychiatric resources with patient including behavioral health services on the second floor, provided open access walk-in hours.  Also discussed PHP program with patient, she is willing to participate.  Referral was made to [facility] PHP. ? ?Upon completion of this admission the Misty Wood was both mentally and medically stable for discharge denying suicidal/homicidal ideation, auditory/visual/tactile hallucinations, delusional thoughts and paranoia.    ? ?Total Time spent with patient: 30 minutes ? ?Past Psychiatric History: See H&P ?Past Medical History:  ?Past Medical History:  ?Diagnosis Date  ? Anxiety   ? Asthma   ? No past surgical history on  file. ?Family History:  ?Family History  ?Problem Relation Age of Onset  ? Asthma Mother   ? ?Family Psychiatric History: See H&P ?Social History:  ?Social History  ? ?Substance and Sexual Activity  ?Alcohol Use Yes  ? Comment: occasionally  ?   ?Social History  ? ?Substance and Sexual Activity  ?Drug  Use Yes  ? Types: Marijuana  ?  ?Social History  ? ?Socioeconomic History  ? Marital status: Single  ?  Spouse name: Not on file  ? Number of children: Not on file  ? Years of education: Not on file  ? Highest education level: Not on file  ?Occupational History  ? Not on file  ?Tobacco Use  ? Smoking status: Never  ? Smokeless tobacco: Never  ?Vaping Use  ? Vaping Use: Never used  ?Substance and Sexual Activity  ? Alcohol use: Yes  ?  Comment: occasionally  ? Drug use: Yes  ?  Types: Marijuana  ? Sexual activity: Not Currently  ?Other Topics Concern  ? Not on file  ?Social History Narrative  ? Not on file  ? ?Social Determinants of Health  ? ?Financial Resource Strain: Not on file  ?Food Insecurity: Not on file  ?Transportation Needs: Not on file  ?Physical Activity: Not on file  ?Stress: Not on file  ?Social Connections: Not on file  ? ?SDOH:  ?SDOH Screenings  ? ?Alcohol Screen: Not on file  ?Depression (PHQ2-9): Medium Risk  ? PHQ-2 Score: 19  ?Financial Resource Strain: Not on file  ?Food Insecurity: Not on file  ?Housing: Not on file  ?Physical Activity: Not on file  ?Social Connections: Not on file  ?Stress: Not on file  ?Tobacco Use: Not on file  ?Transportation Needs: Not on file  ? ? ?Tobacco Cessation:  N/A, patient does not currently use tobacco products ? ?Current Medications:  ?Current Facility-Administered Medications  ?Medication Dose Route Frequency Provider Last Rate Last Admin  ? acetaminophen (TYLENOL) tablet 650 mg  650 mg Oral Q6H PRN Revonda Humphrey, NP      ? alum & mag hydroxide-simeth (MAALOX/MYLANTA) 200-200-20 MG/5ML suspension 30 mL  30 mL Oral Q4H PRN Revonda Humphrey, NP      ? citalopram (CELEXA) tablet 20 mg  20 mg Oral Daily Thomes Lolling H, NP   20 mg at 02/17/22 1316  ? hydrOXYzine (ATARAX) tablet 25 mg  25 mg Oral TID PRN Revonda Humphrey, NP   25 mg at 02/18/22 [id number]  ? magnesium hydroxide (MILK OF MAGNESIA) suspension 30 mL  30 mL Oral Daily PRN Revonda Humphrey, NP       ? traZODone (DESYREL) tablet 50 mg  50 mg Oral QHS PRN Revonda Humphrey, NP   50 mg at 02/17/22 2040  ? ?Current Outpatient Medications  ?Medication Sig Dispense Refill  ? albuterol (PROVENTIL HFA;VENTOLIN HFA) 108 (90 Base) MCG/ACT inhaler Inhale 2 puffs into the lungs every 6 (six) hours as needed for wheezing or shortness of breath. 1 Inhaler 2  ? ? ?PTA Medications: (Not in a hospital admission) ? ? ?Musculoskeletal  ?Strength & Muscle Tone: within normal limits ?Gait & Station: normal ?Patient leans: N/A ? ?Psychiatric Specialty Exam  ?Presentation  ?General Appearance: Appropriate for Environment; Casual ? ?Eye Contact:Good ? ?Speech:Clear and Coherent; Normal Rate ? ?Speech Volume:Normal ? ?Handedness:Right ? ? ?Mood and Affect  ?Mood:Anxious; Depressed ? ?Affect:Congruent ? ? ?Thought Process  ?Thought Processes:Coherent ? ?Descriptions of Associations:Intact ? ?Orientation:Full (Time, Place and Person) ? ?  Thought Content:Logical ? Diagnosis of Schizophrenia or Schizoaffective disorder in past: No ?  ? Hallucinations:Hallucinations: None ? ?Ideas of Reference:None ? ?Suicidal Thoughts:Suicidal Thoughts: No ?SI Active Intent and/or Plan: With Intent; With Plan; With Means to Carry Out ? ?Homicidal Thoughts:Homicidal Thoughts: No ? ? ?Sensorium  ?Memory:Immediate Good; Recent Good; Remote Good ? ?Judgment:Good ? ?Insight:Good ? ? ?Executive Functions  ?Concentration:Good ? ?Attention Span:Good ? ?Recall:Good ? ?[facility] [place] ? ?Language:Good ? ? ?Psychomotor Activity  ?Psychomotor Activity:Psychomotor Activity: Normal ? ? ?Assets  ?Assets:Communication Skills; Desire for Improvement; Financial Resources/Insurance; Housing; Physical Health; Resilience; Social Support ? ? ?Sleep  ?Sleep:Sleep: Fair ?Number of Hours of Sleep: 6 ? ? ?Nutritional Assessment (For OBS and FBC admissions only) ?Has the patient had a weight loss or gain of 10 pounds or more in the last 3 months?: Yes ?Has the patient had  a decrease in food intake/or appetite?: Yes ?Does the patient have dental problems?: No ?Does the patient have eating habits or behaviors that may be indicators of an eating disorder including bingin

## 2022-02-18 NOTE — Discharge Instructions (Addendum)
Safety Plan ?TAVIANA WESTERGREN will reach out to her provider, call 911 or call mobile crisis, or go to nearest emergency room if condition worsens or if suicidal thoughts become active ?Patients' will follow up with resources provided  for outpatient psychiatric services (therapy/medication management).  ?The suicide prevention education provided includes the following: ?Suicide risk factors ?Suicide prevention and interventions ?National Suicide Hotline telephone number ?[facility] assessment telephone number ?[facility] Emergency Assistance 911 ?[place] and/or Residential Mobile Crisis Unit telephone number ?Request made of family/significant other to:   ?Remove weapons (e.g., guns, rifles, knives), all items previously/currently identified as safety concern.   ?Remove drugs/medications (over the counter, prescriptions, illicit drugs), all items previously/currently identified as a safety concern.   ?Patient is instructed prior to discharge to: Take all medications as prescribed by his/her mental healthcare provider. ?Report any adverse effects and or reactions from the medicines to his/her outpatient provider promptly. ?Patient has been instructed & cautioned: To not engage in alcohol and or illegal drug use while on prescription medicines. ?In the event of worsening symptoms, patient is instructed to call the crisis hotline, 911 and or go to the nearest ED for appropriate evaluation and treatment of symptoms. ?To follow-up with his/her primary care provider for your other medical issues, concerns and or health care needs. ?   ?

## 2022-02-18 NOTE — ED Notes (Signed)
Pt discharged with  AVS.  AVS reviewed prior to discharge.  Pt alert, oriented, and ambulatory.  Safety maintained.  °

## 2022-02-18 NOTE — ED Notes (Signed)
Pt sleeping at present, no distress noted.  Monitoring for safety. 

## 2022-03-04 ENCOUNTER — Ambulatory Visit: Payer: Self-pay | Admitting: Student

## 2022-06-23 ENCOUNTER — Ambulatory Visit: Payer: Medicaid Other | Admitting: *Deleted

## 2022-06-23 ENCOUNTER — Ambulatory Visit (INDEPENDENT_AMBULATORY_CARE_PROVIDER_SITE_OTHER): Payer: Medicaid Other

## 2022-06-23 ENCOUNTER — Other Ambulatory Visit: Payer: Self-pay | Admitting: *Deleted

## 2022-06-23 VITALS — BP 107/68 | HR 88 | Ht 63.0 in | Wt 145.8 lb

## 2022-06-23 DIAGNOSIS — O0990 Supervision of high risk pregnancy, unspecified, unspecified trimester: Secondary | ICD-10-CM

## 2022-06-23 DIAGNOSIS — Z3480 Encounter for supervision of other normal pregnancy, unspecified trimester: Secondary | ICD-10-CM

## 2022-06-23 MED ORDER — BLOOD PRESSURE KIT DEVI: 1.0000 | 0 refills | Status: DC

## 2022-06-23 NOTE — Progress Notes (Cosign Needed)
New OB Intake  I connected with  Misty Wood on 06/23/22 at 10:15 AM EDT by in person Visit and verified that I am speaking with the correct person using two identifiers. Nurse is located at [facility] and pt is located at [place].  I discussed the limitations, risks, security and privacy concerns of performing an evaluation and management service by telephone and the availability of in person appointments. I also discussed with the patient that there may be a patient responsible charge related to this service. The patient expressed understanding and agreed to proceed.  I explained I am completing New OB Intake today. We discussed her EDD of 11/04/21 that is based on [place] at 20.6 wks . Pt is G1/P . I reviewed her allergies, medications, Medical/Surgical/OB history, and appropriate screenings. I informed her of [facility] services. [facility] information placed in AVS. Based on history, this is a/an  pregnancy complicated by Late PNC  .   Patient Active Problem List   Diagnosis Date Noted   Severe recurrent major depression without psychotic features ([place]) 07/15/2018   Implanon removal 07/17/2017    Concerns addressed today  Delivery Plans Plans to deliver at [facility]. Patient given information for [facility] Healthy Baby website for more information about Women's and [place]. Patient is not interested in water birth. Offered upcoming OB visit with CNM to discuss further.  MyChart/Babyscripts MyChart access verified. I explained pt will have some visits in office and some virtually. Babyscripts instructions given and order placed. Patient verifies receipt of registration text/e-mail. Account successfully created and app downloaded.  Blood Pressure Cuff/Weight Scale Blood pressure cuff ordered for patient to pick-up from [employer]. Explained after first prenatal appt pt will check weekly and document in 81. Patient does / does not  have weight scale. Weight scale ordered for patient to  pick up from [employer].   Anatomy US Explained first scheduled [place] will be around 19 weeks. Anatomy US scheduled for 21 wks at MFM. Pt notified to arrive at TBD.  Labs Discussed Johnsie Cancel genetic screening with patient. Would like both Panorama and Horizon drawn at new OB visit. Routine prenatal labs needed.  Covid Vaccine Patient has not covid vaccine.    Social Determinants of Health Food Insecurity: Patient expresses food insecurity. Food Market information given to patient; explained patient may visit at the end of first OB appointment. WIC Referral: Patient is interested in referral to Misty Wood.  Transportation: Patient expressed transportation needs. Childcare: Discussed no children allowed at ultrasound appointments. Offered childcare services; patient declines childcare services at this time.  First visit review I reviewed new OB appt with pt. I explained she will have a provider visit that includes labs and a pelvic exam. Explained pt will be seen by Fatima Blank at first visit; encounter routed to appropriate provider. Explained that patient will be seen by pregnancy navigator following visit with provider.   [name], RN 06/23/2022  10:33 AM

## 2022-06-25 ENCOUNTER — Other Ambulatory Visit: Payer: Self-pay | Admitting: *Deleted

## 2022-06-25 DIAGNOSIS — Z3480 Encounter for supervision of other normal pregnancy, unspecified trimester: Secondary | ICD-10-CM

## 2022-07-23 ENCOUNTER — Other Ambulatory Visit: Payer: Self-pay

## 2022-07-23 ENCOUNTER — Ambulatory Visit: Payer: Medicaid Other | Attending: Obstetrics and Gynecology

## 2022-07-23 DIAGNOSIS — O321XX0 Maternal care for breech presentation, not applicable or unspecified: Secondary | ICD-10-CM | POA: Diagnosis not present

## 2022-07-23 DIAGNOSIS — Z3480 Encounter for supervision of other normal pregnancy, unspecified trimester: Secondary | ICD-10-CM | POA: Diagnosis present

## 2022-07-23 DIAGNOSIS — O358XX0 Maternal care for other (suspected) fetal abnormality and damage, not applicable or unspecified: Secondary | ICD-10-CM | POA: Diagnosis not present

## 2022-07-23 DIAGNOSIS — Z363 Encounter for antenatal screening for malformations: Secondary | ICD-10-CM | POA: Diagnosis not present

## 2022-07-23 DIAGNOSIS — Z3A24 24 weeks gestation of pregnancy: Secondary | ICD-10-CM | POA: Diagnosis not present

## 2022-07-23 DIAGNOSIS — O365920 Maternal care for other known or suspected poor fetal growth, second trimester, not applicable or unspecified: Secondary | ICD-10-CM

## 2022-07-23 DIAGNOSIS — O0932 Supervision of pregnancy with insufficient antenatal care, second trimester: Secondary | ICD-10-CM | POA: Diagnosis not present

## 2022-08-04 ENCOUNTER — Ambulatory Visit (INDEPENDENT_AMBULATORY_CARE_PROVIDER_SITE_OTHER): Payer: Medicaid Other | Admitting: Advanced Practice Midwife

## 2022-08-04 ENCOUNTER — Encounter: Payer: Self-pay | Admitting: Advanced Practice Midwife

## 2022-08-04 ENCOUNTER — Other Ambulatory Visit (HOSPITAL_COMMUNITY)
Admission: RE | Admit: 2022-08-04 | Discharge: 2022-08-04 | Disposition: A | Discharge status: Home / Self Care | Payer: Medicaid Other | Source: Ambulatory Visit | Attending: Advanced Practice Midwife | Admitting: Advanced Practice Midwife

## 2022-08-04 VITALS — BP 120/72 | HR 80 | Wt 162.2 lb

## 2022-08-04 DIAGNOSIS — M549 Dorsalgia, unspecified: Secondary | ICD-10-CM

## 2022-08-04 DIAGNOSIS — O0930 Supervision of pregnancy with insufficient antenatal care, unspecified trimester: Secondary | ICD-10-CM

## 2022-08-04 DIAGNOSIS — Z3482 Encounter for supervision of other normal pregnancy, second trimester: Secondary | ICD-10-CM

## 2022-08-04 DIAGNOSIS — Z3A26 26 weeks gestation of pregnancy: Secondary | ICD-10-CM

## 2022-08-04 DIAGNOSIS — O0932 Supervision of pregnancy with insufficient antenatal care, second trimester: Secondary | ICD-10-CM | POA: Diagnosis not present

## 2022-08-04 DIAGNOSIS — O99891 Other specified diseases and conditions complicating pregnancy: Secondary | ICD-10-CM

## 2022-08-04 DIAGNOSIS — Z3480 Encounter for supervision of other normal pregnancy, unspecified trimester: Secondary | ICD-10-CM

## 2022-08-04 MED ORDER — ASPIRIN 81 MG PO TBEC: 81.0000 mg | DELAYED_RELEASE_TABLET | Freq: Every day | ORAL | 3 refills | Status: DC

## 2022-08-04 NOTE — Progress Notes (Signed)
Patient presents as new OB late to care. Pt states she did not know she was pregnant, and has not received any sort of prenatal care. Has started taking a prenatal vitamin. Pt reports smoking and having some alcoholic beverages prior to knowing she was pregnant, and has since only had 1 cup of wine earlier this month. Would also like to discuss the possibility of a water birth. No other concerns at this time.

## 2022-08-04 NOTE — Progress Notes (Signed)
Subjective:   Misty Wood is a 26 y.o. G1P0000 at 34w6dby midtrimester ultrasound being seen today for her first obstetrical visit.  Her obstetrical history is significant for  none  and has Severe recurrent major depression without psychotic features ([place]; Asthma; Chronic bilateral thoracic back pain; Dysmenorrhea; and Supervision of other normal pregnancy, antepartum on their problem list.. Patient does intend to breast feed. Pregnancy history fully reviewed.  Patient reports backache.  HISTORY: OB History  Gravida Para Term Preterm AB Living  1 0 0 0 0 0  SAB IAB Ectopic Multiple Live Births  0 0 0 0 0    # Outcome Date GA Lbr Len/2nd Weight Sex Delivery Anes PTL Lv  1 Current            Past Medical History:  Diagnosis Date   Anxiety    Asthma    History reviewed. No pertinent surgical history. Family History  Problem Relation Age of Onset   Kidney failure Paternal Grandfather    Hypertension Maternal Grandfather    Alcoholism Father    Kidney Stones Father    Asthma Mother    Social History   Tobacco Use   Smoking status: Never   Smokeless tobacco: Never  Vaping Use   Vaping Use: Never used  Substance Use Topics   Alcohol use: Not Currently    Comment: occasionally   Drug use: Not Currently    Types: Marijuana   Allergies  Allergen Reactions   Penicillins Hives and Other (See Comments)    Has patient had a PCN reaction causing immediate rash, facial/tongue/throat swelling, SOB or lightheadedness with hypotension: Yes Has patient had a PCN reaction causing severe rash involving mucus membranes or skin necrosis: No Has patient had a PCN reaction that required hospitalization No Has patient had a PCN reaction occurring within the last 10 years: No If all of the above answers are "NO", then may proceed with Cephalosporin use.   Current Outpatient Medications on File Prior to Visit  Medication Sig Dispense Refill   albuterol (PROVENTIL HFA;VENTOLIN  HFA) 108 (90 Base) MCG/ACT inhaler Inhale 2 puffs into the lungs every 6 (six) hours as needed for wheezing or shortness of breath. 1 Inhaler 2   Prenatal Vit-Fe Fumarate-FA (PRENATAL MULTIVITAMIN) TABS tablet Take 1 tablet by mouth daily at 12 noon.     Blood Pressure Monitoring (BLOOD PRESSURE KIT) DEVI 1 Device by Does not apply route once a week. 1 each 0   citalopram (CELEXA) 20 MG tablet Take 1 tablet (20 mg total) by mouth daily. (Patient not taking: Reported on 06/23/2022) 30 tablet 0   hydrOXYzine (ATARAX) 25 MG tablet Take 1 tablet (25 mg total) by mouth 3 (three) times daily as needed for anxiety. (Patient not taking: Reported on 06/23/2022) 90 tablet 0   traZODone (DESYREL) 50 MG tablet Take 1 tablet (50 mg total) by mouth at bedtime as needed for sleep. (Patient not taking: Reported on 06/23/2022) 30 tablet 0   No current facility-administered medications on file prior to visit.     Indications for ASA therapy (per uptodate)    Two or more of the following: Nulliparity Yes Obesity (body mass index >30 kg/m2) No Family history of preeclampsia in mother or sister No Age ?35 years No Sociodemographic characteristics (African American race, low socioeconomic level) Yes Personal risk factors (eg, previous pregnancy with low birth weight or small for gestational age infant, previous adverse pregnancy outcome [eg, stillbirth], interval >10  years between pregnancies) No   Indications for early 1 hour GTT (per uptodate)  BMI >25 (>23 in Asian women) AND one of the following  Gestational diabetes mellitus in a previous pregnancy Yes Glycated hemoglobin ?5.7 percent (39 mmol/mol), impaired glucose tolerance, or impaired fasting glucose on previous testing No First-degree relative with diabetes No High-risk race/ethnicity (eg, African American, Latino, Native American, [place] American, Pacific Islander) Yes History of cardiovascular disease No Hypertension or on therapy for hypertension  No High-density lipoprotein cholesterol level <35 mg/dL (0.90 mmol/L) and/or a triglyceride level >250 mg/dL (2.82 mmol/L) No Polycystic ovary syndrome No Physical inactivity No Other clinical condition associated with insulin resistance (eg, severe obesity, acanthosis nigricans) No Previous birth of an infant weighing ?4000 g No Previous stillbirth of unknown cause No Exam   Vitals:   08/04/22 1310  BP: 120/72  Pulse: 80  Weight: 162 lb 3.2 oz (73.6 kg)   Fetal Heart Rate (bpm): 154  Uterus:  Fundal Height: 26 cm  Pelvic Exam: Perineum: no hemorrhoids, normal perineum   Vulva: normal external genitalia, no lesions   Vagina:  normal mucosa, normal discharge   Cervix: no lesions and normal, pap smear done.    Adnexa: normal adnexa and no mass, fullness, tenderness   Bony Pelvis: average  System: General: well-developed, well-nourished female in no acute distress   Breast:  normal appearance, no masses or tenderness   Skin: normal coloration and turgor, no rashes   Neurologic: oriented, normal, negative, normal mood   Extremities: normal strength, tone, and muscle mass, ROM of all joints is normal   HEENT PERRLA, extraocular movement intact and sclera clear, anicteric   Mouth/Teeth mucous membranes moist, pharynx normal without lesions and dental hygiene good   Neck supple and no masses   Cardiovascular: regular rate and rhythm   Respiratory:  no respiratory distress, normal breath sounds   Abdomen: soft, non-tender; bowel sounds normal; no masses,  no organomegaly     Assessment:   Pregnancy: G1P0000 Patient Active Problem List   Diagnosis Date Noted   Supervision of other normal pregnancy, antepartum 06/23/2022   Chronic bilateral thoracic back pain 08/22/2021   Dysmenorrhea 08/22/2021   Severe recurrent major depression without psychotic features ([place]) 07/15/2018   Asthma 01/25/2014     Plan:  1. Supervision of other normal pregnancy, antepartum --Anticipatory  guidance about next visits/weeks of pregnancy given.    - aspirin EC 81 MG tablet; Take 1 tablet (81 mg total) by mouth daily. Swallow whole.  Dispense: 30 tablet; Refill: 3  2. [redacted] weeks gestation of pregnancy  - CBC/D/Plt+RPR+Rh+ABO+RubIgG... - Culture, OB Urine - Cervicovaginal ancillary only( [place]) - PANORAMA PRENATAL TEST FULL PANEL - HORIZON CUSTOM - [place] MFM OB COMP + 58 WK; Future  3. Late prenatal care  - [place] MFM OB COMP + 14 WK; Future  4. Back pain affecting pregnancy in second trimester --Rest/ice/heat/warm bath/increase PO fluids/Tylenol/pregnancy support belt      Initial labs drawn. Continue prenatal vitamins. Discussed and offered genetic screening options, including Quad screen/AFP, NIPS testing, and option to decline testing. Benefits/risks/alternatives reviewed. Pt aware that anatomy US is form of genetic screening with lower accuracy in detecting trisomies than blood work.  Pt chooses genetic screening today. NIPS: ordered. Ultrasound discussed; fetal anatomic survey: ordered. Problem list reviewed and updated. The nature of [place] with multiple MDs and other Advanced Practice Providers was explained to patient; also emphasized that residents, students are  part of our team. Routine obstetric precautions reviewed. Return in about 2 weeks (around 08/18/2022) for GTT at next visit, Midwife preferred, LOB.   Fatima Blank, CNM 08/04/22 6:05 PM

## 2022-08-05 LAB — CBC/D/PLT+RPR+RH+ABO+RUBIGG...
Antibody Screen: NEGATIVE
Basophils Absolute: 0 10*3/uL (ref 0.0–0.2)
Basos: 0 %
EOS (ABSOLUTE): 0.1 10*3/uL (ref 0.0–0.4)
Eos: 2 %
HCV Ab: NONREACTIVE
HIV Screen 4th Generation wRfx: NONREACTIVE
Hematocrit: 31.7 % — ABNORMAL LOW (ref 34.0–46.6)
Hemoglobin: 10.5 g/dL — ABNORMAL LOW (ref 11.1–15.9)
Hepatitis B Surface Ag: NEGATIVE
Immature Grans (Abs): 0.1 10*3/uL (ref 0.0–0.1)
Immature Granulocytes: 1 %
Lymphocytes Absolute: 1.1 10*3/uL (ref 0.7–3.1)
Lymphs: 16 %
MCH: 31.5 pg (ref 26.6–33.0)
MCHC: 33.1 g/dL (ref 31.5–35.7)
MCV: 95 fL (ref 79–97)
Monocytes Absolute: 0.4 10*3/uL (ref 0.1–0.9)
Monocytes: 6 %
Neutrophils Absolute: 5.1 10*3/uL (ref 1.4–7.0)
Neutrophils: 75 %
Platelets: 194 10*3/uL (ref 150–450)
RBC: 3.33 x10E6/uL — ABNORMAL LOW (ref 3.77–5.28)
RDW: 13.2 % (ref 11.7–15.4)
RPR Ser Ql: NONREACTIVE
Rh Factor: POSITIVE
Rubella Antibodies, IGG: 2.55 index (ref >0.99)
WBC: 6.9 10*3/uL (ref 3.4–10.8)

## 2022-08-05 LAB — HCV INTERPRETATION

## 2022-08-05 LAB — CERVICOVAGINAL ANCILLARY ONLY
Chlamydia: NEGATIVE
Comment: NEGATIVE
Comment: NEGATIVE
Comment: NORMAL
Neisseria Gonorrhea: NEGATIVE
Trichomonas: NEGATIVE

## 2022-08-06 LAB — URINE CULTURE, OB REFLEX

## 2022-08-06 LAB — CULTURE, OB URINE

## 2022-08-09 LAB — PANORAMA PRENATAL TEST FULL PANEL:PANORAMA TEST PLUS 5 ADDITIONAL MICRODELETIONS: FETAL FRACTION: 21.9

## 2022-08-11 LAB — HORIZON CUSTOM: REPORT SUMMARY: NEGATIVE

## 2022-08-14 ENCOUNTER — Other Ambulatory Visit: Payer: Self-pay | Admitting: Obstetrics

## 2022-08-14 ENCOUNTER — Ambulatory Visit: Payer: Medicaid Other | Attending: Obstetrics

## 2022-08-14 ENCOUNTER — Ambulatory Visit: Payer: Medicaid Other | Admitting: *Deleted

## 2022-08-14 ENCOUNTER — Encounter: Payer: Self-pay | Admitting: Obstetrics

## 2022-08-14 ENCOUNTER — Other Ambulatory Visit: Payer: Self-pay | Admitting: *Deleted

## 2022-08-14 VITALS — BP 116/78 | HR 78

## 2022-08-14 DIAGNOSIS — O43122 Velamentous insertion of umbilical cord, second trimester: Secondary | ICD-10-CM | POA: Insufficient documentation

## 2022-08-14 DIAGNOSIS — Z362 Encounter for other antenatal screening follow-up: Secondary | ICD-10-CM | POA: Diagnosis present

## 2022-08-14 DIAGNOSIS — O365920 Maternal care for other known or suspected poor fetal growth, second trimester, not applicable or unspecified: Secondary | ICD-10-CM | POA: Diagnosis not present

## 2022-08-14 DIAGNOSIS — Z3480 Encounter for supervision of other normal pregnancy, unspecified trimester: Secondary | ICD-10-CM | POA: Insufficient documentation

## 2022-08-14 DIAGNOSIS — O0932 Supervision of pregnancy with insufficient antenatal care, second trimester: Secondary | ICD-10-CM | POA: Diagnosis not present

## 2022-08-14 DIAGNOSIS — O43193 Other malformation of placenta, third trimester: Secondary | ICD-10-CM

## 2022-08-14 DIAGNOSIS — O43192 Other malformation of placenta, second trimester: Secondary | ICD-10-CM

## 2022-08-14 DIAGNOSIS — Z3A27 27 weeks gestation of pregnancy: Secondary | ICD-10-CM | POA: Insufficient documentation

## 2022-08-14 DIAGNOSIS — O365931 Maternal care for other known or suspected poor fetal growth, third trimester, fetus 1: Secondary | ICD-10-CM

## 2022-08-14 DIAGNOSIS — O0933 Supervision of pregnancy with insufficient antenatal care, third trimester: Secondary | ICD-10-CM

## 2022-08-19 ENCOUNTER — Ambulatory Visit (INDEPENDENT_AMBULATORY_CARE_PROVIDER_SITE_OTHER): Payer: Medicaid Other | Admitting: Certified Nurse Midwife

## 2022-08-19 ENCOUNTER — Ambulatory Visit (INDEPENDENT_AMBULATORY_CARE_PROVIDER_SITE_OTHER): Payer: No Payment, Other | Admitting: Licensed Clinical Social Worker

## 2022-08-19 ENCOUNTER — Other Ambulatory Visit: Payer: Medicaid Other

## 2022-08-19 ENCOUNTER — Encounter: Payer: Self-pay | Admitting: Certified Nurse Midwife

## 2022-08-19 VITALS — BP 123/82 | HR 73 | Wt 174.4 lb

## 2022-08-19 DIAGNOSIS — O0993 Supervision of high risk pregnancy, unspecified, third trimester: Secondary | ICD-10-CM

## 2022-08-19 DIAGNOSIS — Z3A28 28 weeks gestation of pregnancy: Secondary | ICD-10-CM

## 2022-08-19 DIAGNOSIS — F439 Reaction to severe stress, unspecified: Secondary | ICD-10-CM | POA: Diagnosis not present

## 2022-08-19 DIAGNOSIS — Z5941 Food insecurity: Secondary | ICD-10-CM

## 2022-08-19 DIAGNOSIS — Z3483 Encounter for supervision of other normal pregnancy, third trimester: Secondary | ICD-10-CM

## 2022-08-19 DIAGNOSIS — Z23 Encounter for immunization: Secondary | ICD-10-CM

## 2022-08-19 DIAGNOSIS — Z3480 Encounter for supervision of other normal pregnancy, unspecified trimester: Secondary | ICD-10-CM

## 2022-08-19 DIAGNOSIS — M549 Dorsalgia, unspecified: Secondary | ICD-10-CM

## 2022-08-19 DIAGNOSIS — O0990 Supervision of high risk pregnancy, unspecified, unspecified trimester: Secondary | ICD-10-CM

## 2022-08-19 DIAGNOSIS — O99891 Other specified diseases and conditions complicating pregnancy: Secondary | ICD-10-CM

## 2022-08-19 NOTE — Progress Notes (Signed)
Pt presents for ROB visit. TDaP vaccine given. No concerns at this time.

## 2022-08-19 NOTE — Patient Instructions (Signed)
Round Ligament Massage & Stretches  Massage: Starting at the middle of your pubic bone, trace little circles in a wide U from your pubic bone to your hip bones on both sides.  Then starting just above your pubic bone, press in and down, alternating sides to create a gentle rocking of your uterus back and forth.  Move your hands up the sides of your belly and back down. Do this 3-5 times upon waking and before bed.  Stretches: Get on hands and knees and alternate arching your back deeply while inhaling, and then rounding your back while exhaling. Modified runners lunge:  - Sit on a chair with half of your bottom on the chair and half off.  - Sit up tall, plant your front foot, and stretch your other foot out behind you.  - Breathe deeply for 5 breaths and then do the other side.    PREGNANCY SUPPORT BELT: You are not alone, Seventy-five percent of women have some sort of abdominal or back pain at some point in their pregnancy. Your baby is growing at a fast pace, which means that your whole body is rapidly trying to adjust to the changes. As your uterus grows, your back may start feeling a bit under stress and this can result in back or abdominal pain that can go from mild, and therefore bearable, to severe pains that will not allow you to sit or lay down comfortably, When it comes to dealing with pregnancy-related pains and cramps, some pregnant women usually prefer natural remedies, which the market is filled with nowadays. For example, wearing a pregnancy support belt can help ease and lessen your discomfort and pain. WHAT ARE THE BENEFITS OF WEARING A PREGNANCY SUPPORT BELT? A pregnancy support belt provides support to the lower portion of the belly taking some of the weight of the growing uterus and distributing to the other parts of your body. It is designed make you comfortable and gives you extra support. Over the years, the pregnancy apparel market has been studying the needs and wants of  pregnant women and they have come up with the most comfortable pregnancy support belts that woman could ever ask for. In fact, you will no longer have to wear a stretched-out or bulky pregnancy belt that is visible underneath your clothes and makes you feel even more uncomfortable. Nowadays, a pregnancy support belt is made of comfortable and stretchy materials that will not irritate your skin but will actually make you feel at ease and you will not even notice you are wearing it. They are easy to put on and adjust during the day and can be worn at night for additional support.  BENEFITS: Relives Back pain Relieves Abdominal Muscle and Leg Pain Stabilizes the Pelvic Ring Offers a Cushioned Abdominal Lift Pad Relieves pressure on the Sciatic Nerve Within Minutes WHERE TO GET YOUR PREGNANCY BELT: [employer] [facility] [employer] [phone] @[address] [place], [place] [address]  [facility]  [address], [place], [place] [address]  [phone]  [facility]  [address] [place], [place] [address]  [phone]  [employer]  [address] [place], [place] [address]  [phone]  [facility]  [address], [place], [place] [address]  [phone]  

## 2022-08-20 LAB — CBC
Hematocrit: 31.6 % — ABNORMAL LOW (ref 34.0–46.6)
Hemoglobin: 10.4 g/dL — ABNORMAL LOW (ref 11.1–15.9)
MCH: 30.8 pg (ref 26.6–33.0)
MCHC: 32.9 g/dL (ref 31.5–35.7)
MCV: 94 fL (ref 79–97)
NRBC: 1 % — ABNORMAL HIGH (ref 0–0)
Platelets: 200 10*3/uL (ref 150–450)
RBC: 3.38 x10E6/uL — ABNORMAL LOW (ref 3.77–5.28)
RDW: 13.4 % (ref 11.7–15.4)
WBC: 8.1 10*3/uL (ref 3.4–10.8)

## 2022-08-20 LAB — GLUCOSE TOLERANCE, 2 HOURS W/ 1HR
Glucose, 1 hour: 105 mg/dL (ref 70–179)
Glucose, 2 hour: 91 mg/dL (ref 70–152)
Glucose, Fasting: 71 mg/dL (ref 70–91)

## 2022-08-20 LAB — RPR: RPR Ser Ql: NONREACTIVE

## 2022-08-20 LAB — HIV ANTIBODY (ROUTINE TESTING W REFLEX): HIV Screen 4th Generation wRfx: NONREACTIVE

## 2022-08-20 NOTE — BH Specialist Note (Signed)
Integrated Behavioral Health Initial In-Person Visit  MRN: [id number] Name: Misty Wood  Number of Integrated Behavioral Health Clinician visits: 1 Session Start time:   9:58am Session End time: 10:17am Total time in minutes: 19 mins in person at [facility]   Types of Service: General Behavioral Integrated Care (BHI)  Interpretor:No. Interpretor Name and Language: none   Warm Hand Off Completed.        Subjective: Misty Wood is a 26 y.o. female accompanied by n/a Patient was referred by Carley Hammed LPN for depressed mood. Patient reports the following symptoms/concerns: Ms. Winger was tearful and appeared upset during office visit. Ms. 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 Self-Care: n/a Life Changes: new pregnancy  Patient and/or Family's Strengths/Protective Factors: Social connections and Sense of purpose  Goals Addressed: Patient will: Reduce symptoms of: anxiety and stress Increase knowledge and/or ability of: coping skills and stress reduction  Demonstrate ability to: Increase healthy adjustment to current life circumstances and Increase adequate support systems for patient/family  Progress towards Goals: Ongoing  Interventions: Interventions utilized: Motivational Interviewing and Link to [employer]  Standardized Assessments completed: PHQ 9  Patient and/or Family Response: Ms.Polich reports stress due to losing employment and food insecurity.  Ms. Filsinger reports pending legal charges is impacting her in various aspects. Ms. Duren reports FOB is not currently involved.   Assessment: Patient currently experiencing situational stress and food insecurity.   Patient may  benefit from integrated behavioral health.  Plan: Follow up with behavioral health clinician on : 09/16/2022 Behavioral recommendations: [employer], keep appt with [facility]IC on 11/20, collaborate with [employer] for employment resources.  Referral(s): Community Resources:  Food "From scale of 1-10, how likely are you to follow plan?":    Gwyndolyn Saxon, LCSW

## 2022-08-21 ENCOUNTER — Ambulatory Visit: Payer: Medicaid Other | Attending: Obstetrics

## 2022-08-21 ENCOUNTER — Ambulatory Visit (HOSPITAL_BASED_OUTPATIENT_CLINIC_OR_DEPARTMENT_OTHER): Payer: Medicaid Other | Admitting: *Deleted

## 2022-08-21 ENCOUNTER — Encounter (HOSPITAL_COMMUNITY): Payer: Self-pay | Admitting: Obstetrics and Gynecology

## 2022-08-21 ENCOUNTER — Inpatient Hospital Stay (HOSPITAL_COMMUNITY)
Admission: AD | Admit: 2022-08-21 | Discharge: 2022-08-21 | Disposition: A | Discharge status: Home / Self Care | Payer: Medicaid Other | Attending: Obstetrics and Gynecology | Admitting: Obstetrics and Gynecology

## 2022-08-21 ENCOUNTER — Ambulatory Visit: Payer: Medicaid Other | Admitting: *Deleted

## 2022-08-21 VITALS — BP 127/61 | HR 78

## 2022-08-21 DIAGNOSIS — O0933 Supervision of pregnancy with insufficient antenatal care, third trimester: Secondary | ICD-10-CM | POA: Diagnosis not present

## 2022-08-21 DIAGNOSIS — Z362 Encounter for other antenatal screening follow-up: Secondary | ICD-10-CM | POA: Insufficient documentation

## 2022-08-21 DIAGNOSIS — O365930 Maternal care for other known or suspected poor fetal growth, third trimester, not applicable or unspecified: Secondary | ICD-10-CM | POA: Insufficient documentation

## 2022-08-21 DIAGNOSIS — O365933 Maternal care for other known or suspected poor fetal growth, third trimester, fetus 3: Secondary | ICD-10-CM

## 2022-08-21 DIAGNOSIS — O365931 Maternal care for other known or suspected poor fetal growth, third trimester, fetus 1: Secondary | ICD-10-CM | POA: Insufficient documentation

## 2022-08-21 DIAGNOSIS — Z3A28 28 weeks gestation of pregnancy: Secondary | ICD-10-CM | POA: Insufficient documentation

## 2022-08-21 DIAGNOSIS — O368330 Maternal care for abnormalities of the fetal heart rate or rhythm, third trimester, not applicable or unspecified: Secondary | ICD-10-CM | POA: Insufficient documentation

## 2022-08-21 DIAGNOSIS — O368390 Maternal care for abnormalities of the fetal heart rate or rhythm, unspecified trimester, not applicable or unspecified: Secondary | ICD-10-CM | POA: Diagnosis not present

## 2022-08-21 DIAGNOSIS — Z3480 Encounter for supervision of other normal pregnancy, unspecified trimester: Secondary | ICD-10-CM

## 2022-08-21 DIAGNOSIS — O43193 Other malformation of placenta, third trimester: Secondary | ICD-10-CM | POA: Insufficient documentation

## 2022-08-21 DIAGNOSIS — O365990 Maternal care for other known or suspected poor fetal growth, unspecified trimester, not applicable or unspecified: Secondary | ICD-10-CM | POA: Diagnosis present

## 2022-08-21 MED ORDER — LACTATED RINGERS IV BOLUS
1000.0000 mL | Freq: Once | INTRAVENOUS | Status: AC
  Administered 2022-08-21: 1000 mL via INTRAVENOUS

## 2022-08-21 NOTE — Discharge Instructions (Signed)
It was wonderful taking care of you tonight!  We monitored your baby for 2 hours and your baby's heart rate did get more reactive which is wonderful.  Please be sure to follow-up with the maternal-fetal medicine doctors next week for your routine monitoring.  If you notice any bleeding, gush of fluid, contractions, decreased fetal movement please be reevaluated.  I hope you have a wonderful day!

## 2022-08-21 NOTE — Procedures (Signed)
Misty Wood [birth year] [redacted]w[redacted]d  Fetus A Non-Stress Test Interpretation for 08/21/22  Indication: IUGR and MCI  Fetal Heart Rate A Mode: External Baseline Rate (A): 140 bpm Variability: Minimal Accelerations: 10 x 10 Multiple birth?: No  Uterine Activity Mode: Toco Contraction Frequency (min): none Resting Tone Palpated: Relaxed  Interpretation (Fetal Testing) Nonstress Test Interpretation: Reactive Overall Impression: Reassuring for gestational age Comments: tracing reviewed by Dr. Grace Bushy

## 2022-08-21 NOTE — Progress Notes (Signed)
   PRENATAL VISIT NOTE  Subjective:  Misty Wood is a 26 y.o. G1P0000 at [redacted]w[redacted]d being seen today for ongoing prenatal care.  She is currently monitored for the following issues for this high-risk pregnancy and has Severe recurrent major depression without psychotic features (HCC); Asthma; Chronic bilateral thoracic back pain; Dysmenorrhea; Supervision of other normal pregnancy, antepartum; and IUGR (intrauterine growth restriction) affecting care of mother on their problem list.  Patient reports backache.  Contractions: Not present. Vag. Bleeding: None.  Movement: Present. Denies leaking of fluid.   The following portions of the patient's history were reviewed and updated as appropriate: allergies, current medications, past family history, past medical history, past social history, past surgical history and problem list.   Objective:   Vitals:   08/19/22 0914  BP: 123/82  Pulse: 73  Weight: 174 lb 6.4 oz (79.1 kg)    Fetal Status: Fetal Heart Rate (bpm): 143 Fundal Height: 28 cm Movement: Present     General:  Alert, oriented and cooperative. Patient is in no acute distress.  Skin: Skin is warm and dry. No rash noted.   Cardiovascular: Normal heart rate noted  Respiratory: Normal respiratory effort, no problems with respiration noted  Abdomen: Soft, gravid, appropriate for gestational age.  Pain/Pressure: Absent     Pelvic: Cervical exam deferred        Extremities: Normal range of motion.  Edema: None  Mental Status: Normal mood and affect. Normal behavior. Normal judgment and thought content.   Assessment and Plan:  Pregnancy: G1P0000 at [redacted]w[redacted]d 1. Supervision of other normal pregnancy, antepartum - Patient feeling frequent and vigorous fetal movement.  - Patient tearful during visit, but has a IBH appointment today. States there are certain "social stressors" that are triggering her today and prefers to discuss with IBH.  - Anticipatory guidance provided on 3rd trimester  expectations and change in prenatal schedule from monthly to bi-weekly.   2. [redacted] weeks gestation of pregnancy - CBC - Glucose Tolerance, 2 Hours w/1 Hour - HIV Antibody (routine testing w rflx) - RPR - Tdap vaccine greater than or equal to 7yo IM  3. Back pain affecting pregnancy in second trimester - Recommended a maternity support belt for back pain.  - Also discussed comfort measures and stretching that may help relieve back pain in pregnancy.   Preterm labor symptoms and general obstetric precautions including but not limited to vaginal bleeding, contractions, leaking of fluid and fetal movement were reviewed in detail with the patient. Please refer to After Visit Summary for other counseling recommendations.   Return in about 2 weeks (around 09/02/2022) for LOB.  Future Appointments  Date Time Provider Department Center  08/26/2022  1:30 PM [facility] NURSE [facility]  08/26/2022  1:45 PM [facility] US7 [facility]  08/26/2022  2:15 PM [facility] NST [facility]  09/02/2022 10:35 AM Hermina Staggers, MD [facility] None  09/04/2022  9:30 AM [facility] NURSE [facility]  09/04/2022  9:45 AM [facility] US6 [facility]  09/04/2022 10:45 AM [facility] NST [facility]  09/16/2022  2:50 PM Federico Flake, MD [facility] None  10/01/2022  1:50 PM Lennart Pall, MD [facility] None   Courtnie Brenes Danella Deis) Suzie Portela, MSN, CNM  Center for [facility]  08/21/22 6:22 PM

## 2022-08-21 NOTE — MAU Note (Signed)
...  Misty Wood is a 26 y.o. at [redacted]w[redacted]d here in MAU reporting: Sent over from MFM for fetal monitoring. Per Dr. Grace Bushy patient passed NST but had minimal variability. Denies VB, LOF, or pain. +FM. She reports the baby is moving for his normal pattern. She reports her baby has never been super active.  Late to prenatal care. IUGR with an EFW 3.8%. Marginal cord insertion.  Onset of complaint: Today Pain score: Denies pain.  FHT: 155 initial external

## 2022-08-21 NOTE — MAU Provider Note (Signed)
History     CSN: [id number]  Arrival date and time: 08/21/22 1544   None     Chief Complaint  Patient presents with   Fetal Monitoring   Patient is a 26 year old G1 at 28 weeks 4 days presenting after being evaluated by the MFM doctor.NST was reactive but noted minimal variability with good fetal movement and amniotic fluid volume.  MFM did recommend extended monitoring and was sent to the MAU for 2 to 4 hours to assess variability.  It was recommended if variability or reactivity was depressed consider admission with betamethasone.  Patient denies any issues or complaints.  Reports that she is going to MFM for routine monitoring due to IUGR.  IUGR 3rd percentile.    OB History     Gravida  1   Para  0   Term  0   Preterm  0   AB  0   Living  0      SAB  0   IAB  0   Ectopic  0   Multiple  0   Live Births  0           Past Medical History:  Diagnosis Date   Anxiety    Asthma     History reviewed. No pertinent surgical history.  Family History  Problem Relation Age of Onset   Asthma Mother    Alcoholism Father    Kidney Stones Father    Stroke Maternal Grandfather    Hypertension Maternal Grandfather    Kidney failure Paternal Grandfather    Cancer Other    Birth defects Neg Hx    Diabetes Neg Hx    Heart disease Neg Hx     Social History   Tobacco Use   Smoking status: Never   Smokeless tobacco: Never  Vaping Use   Vaping Use: Never used  Substance Use Topics   Alcohol use: Not Currently    Comment: occasionally   Drug use: Not Currently    Types: Marijuana    Allergies:  Allergies  Allergen Reactions   Penicillins Hives and Other (See Comments)    Has patient had a PCN reaction causing immediate rash, facial/tongue/throat swelling, SOB or lightheadedness with hypotension: Yes Has patient had a PCN reaction causing severe rash involving mucus membranes or skin necrosis: No Has patient had a PCN reaction that required  hospitalization No Has patient had a PCN reaction occurring within the last 10 years: No If all of the above answers are "NO", then may proceed with Cephalosporin use.    Medications Prior to Admission  Medication Sig Dispense Refill Last Dose   albuterol (PROVENTIL HFA;VENTOLIN HFA) 108 (90 Base) MCG/ACT inhaler Inhale 2 puffs into the lungs every 6 (six) hours as needed for wheezing or shortness of breath. 1 Inhaler 2 Past Month   Prenatal Vit-Fe Fumarate-FA (PRENATAL MULTIVITAMIN) TABS tablet Take 1 tablet by mouth daily at 12 noon.   08/21/2022   aspirin EC 81 MG tablet Take 1 tablet (81 mg total) by mouth daily. Swallow whole. (Patient not taking: Reported on 08/14/2022) 30 tablet 3    Blood Pressure Monitoring (BLOOD PRESSURE KIT) DEVI 1 Device by Does not apply route once a week. (Patient not taking: Reported on 08/14/2022) 1 each 0    citalopram (CELEXA) 20 MG tablet Take 1 tablet (20 mg total) by mouth daily. (Patient not taking: Reported on 06/23/2022) 30 tablet 0    hydrOXYzine (ATARAX) 25 MG tablet Take 1 tablet (25  mg total) by mouth 3 (three) times daily as needed for anxiety. (Patient not taking: Reported on 06/23/2022) 90 tablet 0     Review of Systems  Constitutional:  Negative for fatigue and fever.  HENT:  Negative for congestion and rhinorrhea.   Eyes:  Negative for visual disturbance.  Respiratory:  Negative for shortness of breath.   Cardiovascular:  Negative for chest pain.  Gastrointestinal:  Negative for abdominal distention, abdominal pain, constipation, diarrhea and nausea.  Endocrine: Negative for polyuria.  Genitourinary:  Negative for vaginal bleeding and vaginal discharge.  Musculoskeletal:  Negative for arthralgias.  Neurological:  Negative for headaches.  All other systems reviewed and are negative.  Physical Exam   Blood pressure 120/67, pulse 74, temperature 98.9 F (37.2 C), temperature source Oral, resp. rate 17, height _0  (1.6 m), weight 79.4 kg,  SpO2 97 %.  Physical Exam Vitals reviewed.  Constitutional:      Appearance: She is normal weight.  HENT:     Head: Normocephalic and atraumatic.     Nose: Nose normal.     Mouth/Throat:     Mouth: Mucous membranes are moist.  Eyes:     Extraocular Movements: Extraocular movements intact.     Pupils: Pupils are equal, round, and reactive to light.  Cardiovascular:     Rate and Rhythm: Normal rate.  Pulmonary:     Effort: Pulmonary effort is normal.  Abdominal:     Comments: Gravid  Musculoskeletal:        General: Normal range of motion.     Cervical back: Normal range of motion.  Skin:    General: Skin is warm.     Capillary Refill: Capillary refill takes less than 2 seconds.  Neurological:     General: No focal deficit present.     Mental Status: She is alert.  Psychiatric:        Mood and Affect: Mood normal.     MAU Course  Procedures  MDM NST LR bolus  Assessment and Plan  Misty Wood is a 26 year old female presenting from the maternal-fetal medicine office for prolonged monitoring.  Was seen for BPP and NST due to IUGR (3rd percentile).  It was recommended that she come for further evaluation because although past the fetal testing the strip was minimally reactive.  On arrival patient had multiple variable decelerations but after LR fluid bolus of 1 L patient's strip became more reactive with fewer decelerations.  Consulted with on-call attending who recommended that if the strip became more reactive and decelerations ceased patient was likely good for discharge home.  Patient discharged home with strict return precautions and already scheduled for follow-up with MFM.  Patient had no further questions or concerns.  Concepcion Living 08/21/2022, 6:47 PM

## 2022-08-26 ENCOUNTER — Encounter: Payer: Self-pay | Admitting: *Deleted

## 2022-08-26 ENCOUNTER — Ambulatory Visit: Payer: Medicaid Other | Admitting: *Deleted

## 2022-08-26 ENCOUNTER — Other Ambulatory Visit: Payer: Self-pay | Admitting: *Deleted

## 2022-08-26 ENCOUNTER — Ambulatory Visit: Payer: Medicaid Other | Attending: Obstetrics

## 2022-08-26 ENCOUNTER — Ambulatory Visit (HOSPITAL_BASED_OUTPATIENT_CLINIC_OR_DEPARTMENT_OTHER): Payer: Medicaid Other | Admitting: *Deleted

## 2022-08-26 VITALS — BP 126/67 | HR 72

## 2022-08-26 DIAGNOSIS — Z362 Encounter for other antenatal screening follow-up: Secondary | ICD-10-CM

## 2022-08-26 DIAGNOSIS — Z3A29 29 weeks gestation of pregnancy: Secondary | ICD-10-CM

## 2022-08-26 DIAGNOSIS — O365930 Maternal care for other known or suspected poor fetal growth, third trimester, not applicable or unspecified: Secondary | ICD-10-CM | POA: Diagnosis not present

## 2022-08-26 DIAGNOSIS — Z3A00 Weeks of gestation of pregnancy not specified: Secondary | ICD-10-CM | POA: Insufficient documentation

## 2022-08-26 DIAGNOSIS — Z3480 Encounter for supervision of other normal pregnancy, unspecified trimester: Secondary | ICD-10-CM | POA: Insufficient documentation

## 2022-08-26 DIAGNOSIS — O365920 Maternal care for other known or suspected poor fetal growth, second trimester, not applicable or unspecified: Secondary | ICD-10-CM | POA: Insufficient documentation

## 2022-08-26 DIAGNOSIS — O0932 Supervision of pregnancy with insufficient antenatal care, second trimester: Secondary | ICD-10-CM | POA: Insufficient documentation

## 2022-08-26 DIAGNOSIS — O43193 Other malformation of placenta, third trimester: Secondary | ICD-10-CM

## 2022-08-26 DIAGNOSIS — O43123 Velamentous insertion of umbilical cord, third trimester: Secondary | ICD-10-CM | POA: Diagnosis not present

## 2022-08-26 DIAGNOSIS — O0933 Supervision of pregnancy with insufficient antenatal care, third trimester: Secondary | ICD-10-CM

## 2022-08-26 DIAGNOSIS — O365931 Maternal care for other known or suspected poor fetal growth, third trimester, fetus 1: Secondary | ICD-10-CM | POA: Insufficient documentation

## 2022-08-26 DIAGNOSIS — O365990 Maternal care for other known or suspected poor fetal growth, unspecified trimester, not applicable or unspecified: Secondary | ICD-10-CM | POA: Insufficient documentation

## 2022-08-26 NOTE — Procedures (Signed)
AHSLEY ATTWOOD [birth year] [redacted]w[redacted]d  Fetus A Non-Stress Test Interpretation for 08/26/22  Indication: IUGR  Fetal Heart Rate A Mode: External Baseline Rate (A): 140 bpm Variability: Moderate Accelerations: 10 x 10 Decelerations: None Multiple birth?: No  Uterine Activity Mode: Palpation, Toco Contraction Frequency (min): 1 uc Contraction Duration (sec): 50 Contraction Quality: Mild Resting Tone Palpated: Relaxed Resting Time: Adequate  Interpretation (Fetal Testing) Nonstress Test Interpretation: Reactive Overall Impression: Reassuring for gestational age Comments: Dr. Judeth Cornfield reviewed tracing

## 2022-09-02 ENCOUNTER — Ambulatory Visit (INDEPENDENT_AMBULATORY_CARE_PROVIDER_SITE_OTHER): Payer: Medicaid Other | Admitting: Obstetrics and Gynecology

## 2022-09-02 ENCOUNTER — Encounter: Payer: Self-pay | Admitting: Obstetrics and Gynecology

## 2022-09-02 ENCOUNTER — Other Ambulatory Visit: Payer: Self-pay

## 2022-09-02 VITALS — BP 117/83 | HR 81 | Wt 187.0 lb

## 2022-09-02 DIAGNOSIS — O365930 Maternal care for other known or suspected poor fetal growth, third trimester, not applicable or unspecified: Secondary | ICD-10-CM

## 2022-09-02 DIAGNOSIS — Z3483 Encounter for supervision of other normal pregnancy, third trimester: Secondary | ICD-10-CM

## 2022-09-02 DIAGNOSIS — Z3A30 30 weeks gestation of pregnancy: Secondary | ICD-10-CM

## 2022-09-02 DIAGNOSIS — Z3480 Encounter for supervision of other normal pregnancy, unspecified trimester: Secondary | ICD-10-CM

## 2022-09-02 NOTE — Progress Notes (Signed)
Subjective:  Misty Wood is a 26 y.o. G1P0000 at [redacted]w[redacted]d being seen today for ongoing prenatal care.  She is currently monitored for the following issues for this high-risk pregnancy and has Severe recurrent major depression without psychotic features (HCC); Asthma; Chronic bilateral thoracic back pain; Supervision of other normal pregnancy, antepartum; and IUGR (intrauterine growth restriction) affecting care of mother on their problem list.  Patient reports no complaints.  Contractions: Irritability. Vag. Bleeding: None.  Movement: Present. Denies leaking of fluid.   The following portions of the patient's history were reviewed and updated as appropriate: allergies, current medications, past family history, past medical history, past social history, past surgical history and problem list. Problem list updated.  Objective:   Vitals:   09/02/22 1036  BP: 117/83  Pulse: 81  Weight: 187 lb (84.8 kg)    Fetal Status: Fetal Heart Rate (bpm): 140   Movement: Present     General:  Alert, oriented and cooperative. Patient is in no acute distress.  Skin: Skin is warm and dry. No rash noted.   Cardiovascular: Normal heart rate noted  Respiratory: Normal respiratory effort, no problems with respiration noted  Abdomen: Soft, gravid, appropriate for gestational age. Pain/Pressure: Absent     Pelvic:  Cervical exam deferred        Extremities: Normal range of motion.  Edema: None  Mental Status: Normal mood and affect. Normal behavior. Normal judgment and thought content.   Urinalysis:      Assessment and Plan:  Pregnancy: G1P0000 at [redacted]w[redacted]d  1. Supervision of other normal pregnancy, antepartum Stable  2. Poor fetal growth affecting management of mother in third trimester, single or unspecified fetus Continue with weekly antenatal testing and serial growth scans IOL based on fetal status, reviewed with pt.  Preterm labor symptoms and general obstetric precautions including but not limited  to vaginal bleeding, contractions, leaking of fluid and fetal movement were reviewed in detail with the patient. Please refer to After Visit Summary for other counseling recommendations.  Return in about 2 weeks (around 09/16/2022) for OB visit, face to face, MD only.   Hermina Staggers, MD

## 2022-09-02 NOTE — Patient Instructions (Signed)

## 2022-09-04 ENCOUNTER — Ambulatory Visit (HOSPITAL_BASED_OUTPATIENT_CLINIC_OR_DEPARTMENT_OTHER): Payer: Medicaid Other | Admitting: *Deleted

## 2022-09-04 ENCOUNTER — Ambulatory Visit: Payer: Medicaid Other | Admitting: *Deleted

## 2022-09-04 ENCOUNTER — Other Ambulatory Visit: Payer: Self-pay | Admitting: Obstetrics

## 2022-09-04 ENCOUNTER — Other Ambulatory Visit: Payer: Self-pay | Admitting: *Deleted

## 2022-09-04 ENCOUNTER — Ambulatory Visit: Payer: Medicaid Other | Attending: Obstetrics

## 2022-09-04 VITALS — BP 121/56 | HR 66

## 2022-09-04 DIAGNOSIS — O43193 Other malformation of placenta, third trimester: Secondary | ICD-10-CM | POA: Diagnosis not present

## 2022-09-04 DIAGNOSIS — O365930 Maternal care for other known or suspected poor fetal growth, third trimester, not applicable or unspecified: Secondary | ICD-10-CM | POA: Diagnosis not present

## 2022-09-04 DIAGNOSIS — Z3A30 30 weeks gestation of pregnancy: Secondary | ICD-10-CM

## 2022-09-04 DIAGNOSIS — O365931 Maternal care for other known or suspected poor fetal growth, third trimester, fetus 1: Secondary | ICD-10-CM

## 2022-09-04 DIAGNOSIS — O0933 Supervision of pregnancy with insufficient antenatal care, third trimester: Secondary | ICD-10-CM | POA: Diagnosis not present

## 2022-09-04 DIAGNOSIS — Z362 Encounter for other antenatal screening follow-up: Secondary | ICD-10-CM | POA: Diagnosis not present

## 2022-09-04 NOTE — Procedures (Signed)
KENIDY CROSSLAND January 30, 1996 [redacted]w[redacted]d  Fetus A Non-Stress Test Interpretation for 09/04/22  Indication: IUGR and marg cord insertion  Fetal Heart Rate A Mode: External Baseline Rate (A): 140 bpm Variability: Moderate Accelerations: 15 x 15 Decelerations: None Multiple birth?: No  Uterine Activity Mode: Toco Contraction Frequency (min): none Resting Tone Palpated: Relaxed  Interpretation (Fetal Testing) Nonstress Test Interpretation: Reactive Overall Impression: Reassuring for gestational age Comments: tracing reviewed by Dr. Grace Bushy

## 2022-09-11 ENCOUNTER — Ambulatory Visit: Payer: Medicaid Other | Admitting: *Deleted

## 2022-09-11 ENCOUNTER — Ambulatory Visit: Payer: Medicaid Other

## 2022-09-11 ENCOUNTER — Ambulatory Visit (HOSPITAL_BASED_OUTPATIENT_CLINIC_OR_DEPARTMENT_OTHER): Payer: Medicaid Other | Admitting: *Deleted

## 2022-09-11 ENCOUNTER — Ambulatory Visit: Payer: Medicaid Other | Attending: Obstetrics and Gynecology

## 2022-09-11 VITALS — BP 139/85 | HR 66

## 2022-09-11 DIAGNOSIS — O365930 Maternal care for other known or suspected poor fetal growth, third trimester, not applicable or unspecified: Secondary | ICD-10-CM | POA: Insufficient documentation

## 2022-09-11 DIAGNOSIS — O43193 Other malformation of placenta, third trimester: Secondary | ICD-10-CM | POA: Diagnosis not present

## 2022-09-11 DIAGNOSIS — Z3A31 31 weeks gestation of pregnancy: Secondary | ICD-10-CM | POA: Diagnosis not present

## 2022-09-11 DIAGNOSIS — O43123 Velamentous insertion of umbilical cord, third trimester: Secondary | ICD-10-CM | POA: Diagnosis not present

## 2022-09-11 DIAGNOSIS — O0933 Supervision of pregnancy with insufficient antenatal care, third trimester: Secondary | ICD-10-CM | POA: Diagnosis not present

## 2022-09-11 DIAGNOSIS — Z3480 Encounter for supervision of other normal pregnancy, unspecified trimester: Secondary | ICD-10-CM

## 2022-09-11 NOTE — Procedures (Signed)
Misty Wood May 31, 1996 [redacted]w[redacted]d  Fetus A Non-Stress Test Interpretation for 09/11/22  Indication: IUGR  Fetal Heart Rate A Mode: External Baseline Rate (A): 140 bpm Variability: Minimal, Moderate Accelerations: 10 x 10 Decelerations: None Multiple birth?: No  Uterine Activity Mode: Palpation, Toco Contraction Frequency (min): none Resting Tone Palpated: Relaxed  Interpretation (Fetal Testing) Nonstress Test Interpretation: Reactive Overall Impression: Reassuring for gestational age Comments: Dr. Darra Lis reviewed tracing

## 2022-09-16 ENCOUNTER — Encounter: Payer: Medicaid Other | Admitting: Family Medicine

## 2022-09-16 ENCOUNTER — Other Ambulatory Visit: Payer: Self-pay

## 2022-09-16 ENCOUNTER — Inpatient Hospital Stay (HOSPITAL_COMMUNITY)
Admission: AD | Admit: 2022-09-16 | Discharge: 2022-09-22 | DRG: 807 | Disposition: A | Discharge status: Home / Self Care | Payer: Medicaid Other | Attending: Obstetrics & Gynecology | Admitting: Obstetrics & Gynecology

## 2022-09-16 ENCOUNTER — Encounter (HOSPITAL_COMMUNITY): Payer: Self-pay | Admitting: Obstetrics and Gynecology

## 2022-09-16 DIAGNOSIS — D696 Thrombocytopenia, unspecified: Secondary | ICD-10-CM | POA: Diagnosis present

## 2022-09-16 DIAGNOSIS — O1414 Severe pre-eclampsia complicating childbirth: Secondary | ICD-10-CM | POA: Diagnosis present

## 2022-09-16 DIAGNOSIS — O1493 Unspecified pre-eclampsia, third trimester: Secondary | ICD-10-CM | POA: Diagnosis not present

## 2022-09-16 DIAGNOSIS — J45909 Unspecified asthma, uncomplicated: Secondary | ICD-10-CM | POA: Diagnosis present

## 2022-09-16 DIAGNOSIS — O4593 Premature separation of placenta, unspecified, third trimester: Secondary | ICD-10-CM | POA: Diagnosis present

## 2022-09-16 DIAGNOSIS — O9081 Anemia of the puerperium: Secondary | ICD-10-CM | POA: Diagnosis present

## 2022-09-16 DIAGNOSIS — Z3480 Encounter for supervision of other normal pregnancy, unspecified trimester: Secondary | ICD-10-CM

## 2022-09-16 DIAGNOSIS — R030 Elevated blood-pressure reading, without diagnosis of hypertension: Secondary | ICD-10-CM | POA: Diagnosis present

## 2022-09-16 DIAGNOSIS — Z880 Allergy status to penicillin: Secondary | ICD-10-CM | POA: Diagnosis not present

## 2022-09-16 DIAGNOSIS — Z3A32 32 weeks gestation of pregnancy: Secondary | ICD-10-CM

## 2022-09-16 DIAGNOSIS — O139 Gestational [pregnancy-induced] hypertension without significant proteinuria, unspecified trimester: Secondary | ICD-10-CM | POA: Insufficient documentation

## 2022-09-16 DIAGNOSIS — O9952 Diseases of the respiratory system complicating childbirth: Secondary | ICD-10-CM | POA: Diagnosis present

## 2022-09-16 DIAGNOSIS — O365930 Maternal care for other known or suspected poor fetal growth, third trimester, not applicable or unspecified: Secondary | ICD-10-CM | POA: Diagnosis present

## 2022-09-16 DIAGNOSIS — O4423 Partial placenta previa NOS or without hemorrhage, third trimester: Secondary | ICD-10-CM | POA: Diagnosis not present

## 2022-09-16 DIAGNOSIS — O0933 Supervision of pregnancy with insufficient antenatal care, third trimester: Secondary | ICD-10-CM | POA: Diagnosis not present

## 2022-09-16 DIAGNOSIS — O43193 Other malformation of placenta, third trimester: Secondary | ICD-10-CM | POA: Diagnosis not present

## 2022-09-16 DIAGNOSIS — O43123 Velamentous insertion of umbilical cord, third trimester: Secondary | ICD-10-CM | POA: Diagnosis present

## 2022-09-16 DIAGNOSIS — O365990 Maternal care for other known or suspected poor fetal growth, unspecified trimester, not applicable or unspecified: Secondary | ICD-10-CM | POA: Diagnosis present

## 2022-09-16 DIAGNOSIS — O9912 Other diseases of the blood and blood-forming organs and certain disorders involving the immune mechanism complicating childbirth: Secondary | ICD-10-CM | POA: Diagnosis not present

## 2022-09-16 DIAGNOSIS — O4590 Premature separation of placenta, unspecified, unspecified trimester: Secondary | ICD-10-CM | POA: Diagnosis not present

## 2022-09-16 HISTORY — DX: Depression, unspecified: F32.A

## 2022-09-16 LAB — CBC
HCT: 30 % — ABNORMAL LOW (ref 36.0–46.0)
Hemoglobin: 9.8 g/dL — ABNORMAL LOW (ref 12.0–15.0)
MCH: 30.6 pg (ref 26.0–34.0)
MCHC: 32.7 g/dL (ref 30.0–36.0)
MCV: 93.8 fL (ref 80.0–100.0)
Platelets: 167 10*3/uL (ref 150–400)
RBC: 3.2 MIL/uL — ABNORMAL LOW (ref 3.87–5.11)
RDW: 13.6 % (ref 11.5–15.5)
WBC: 9.2 10*3/uL (ref 4.0–10.5)
nRBC: 1 % — ABNORMAL HIGH (ref 0.0–0.2)

## 2022-09-16 LAB — COMPREHENSIVE METABOLIC PANEL
ALT: 17 U/L (ref 0–44)
AST: 22 U/L (ref 15–41)
Albumin: 2.5 g/dL — ABNORMAL LOW (ref 3.5–5.0)
Alkaline Phosphatase: 117 U/L (ref 38–126)
Anion gap: 7 (ref 5–15)
BUN: 13 mg/dL (ref 6–20)
CO2: 19 mmol/L — ABNORMAL LOW (ref 22–32)
Calcium: 8.8 mg/dL — ABNORMAL LOW (ref 8.9–10.3)
Chloride: 110 mmol/L (ref 98–111)
Creatinine, Ser: 0.8 mg/dL (ref 0.44–1.00)
GFR, Estimated: >60 mL/min (ref >60)
Glucose, Bld: 87 mg/dL (ref 70–99)
Potassium: 4.3 mmol/L (ref 3.5–5.1)
Sodium: 136 mmol/L (ref 135–145)
Total Bilirubin: 0.1 mg/dL — ABNORMAL LOW (ref 0.3–1.2)
Total Protein: 5.9 g/dL — ABNORMAL LOW (ref 6.5–8.1)

## 2022-09-16 LAB — PROTEIN / CREATININE RATIO, URINE
Creatinine, Urine: 30 mg/dL
Protein Creatinine Ratio: 0.83 mg/mg{Cre} — ABNORMAL HIGH (ref 0.00–0.15)
Total Protein, Urine: 25 mg/dL

## 2022-09-16 MED ORDER — LABETALOL HCL 5 MG/ML IV SOLN
40.0000 mg | INTRAVENOUS | Status: DC | PRN
  Administered 2022-09-18: 40 mg via INTRAVENOUS

## 2022-09-16 MED ORDER — LABETALOL HCL 5 MG/ML IV SOLN
80.0000 mg | INTRAVENOUS | Status: DC | PRN
  Administered 2022-09-17 – 2022-09-18 (×2): 80 mg via INTRAVENOUS
  Filled 2022-09-16 (×2): qty 16

## 2022-09-16 MED ORDER — BETAMETHASONE SOD PHOS & ACET 6 (3-3) MG/ML IJ SUSP
12.0000 mg | INTRAMUSCULAR | Status: AC
  Administered 2022-09-16 – 2022-09-17 (×2): 12 mg via INTRAMUSCULAR
  Filled 2022-09-16 (×2): qty 5

## 2022-09-16 MED ORDER — LABETALOL HCL 5 MG/ML IV SOLN
40.0000 mg | INTRAVENOUS | Status: DC | PRN
  Administered 2022-09-17: 40 mg via INTRAVENOUS
  Filled 2022-09-16 (×2): qty 8

## 2022-09-16 MED ORDER — HYDRALAZINE HCL 20 MG/ML IJ SOLN
10.0000 mg | INTRAMUSCULAR | Status: DC | PRN
  Administered 2022-09-18: 10 mg via INTRAVENOUS
  Filled 2022-09-16: qty 1

## 2022-09-16 MED ORDER — LABETALOL HCL 5 MG/ML IV SOLN
20.0000 mg | INTRAVENOUS | Status: DC | PRN
  Administered 2022-09-18: 20 mg via INTRAVENOUS

## 2022-09-16 MED ORDER — ALBUTEROL SULFATE HFA 108 (90 BASE) MCG/ACT IN AERS: 2.0000 | INHALATION_SPRAY | Freq: Four times a day (QID) | RESPIRATORY_TRACT | Status: DC | PRN

## 2022-09-16 MED ORDER — HYDRALAZINE HCL 20 MG/ML IJ SOLN
10.0000 mg | INTRAMUSCULAR | Status: DC | PRN
  Filled 2022-09-16: qty 1

## 2022-09-16 MED ORDER — LABETALOL HCL 5 MG/ML IV SOLN: 80.0000 mg | INTRAVENOUS | Status: DC | PRN

## 2022-09-16 MED ORDER — LABETALOL HCL 5 MG/ML IV SOLN
20.0000 mg | INTRAVENOUS | Status: DC | PRN
  Administered 2022-09-16 – 2022-09-17 (×2): 20 mg via INTRAVENOUS
  Filled 2022-09-16 (×3): qty 4

## 2022-09-16 MED ORDER — PRENATAL MULTIVITAMIN CH: 1.0000 | ORAL_TABLET | Freq: Every day | ORAL | Status: DC

## 2022-09-16 NOTE — MAU Note (Signed)
Misty Wood is a 26 y.o. at [redacted]w[redacted]d here in MAU reporting: she took her BP @ home last night and this morning and BP was "really high" .  Denies HA and visual disturbances, reports intermittent sharp epigastric pain earlier this morning that has since resolved.  Also reports mid chest pain, states it's a constant tightness. Denies VB or LOF.  Endorses +FM. LMP: NA Onset of complaint: last night Pain score: 6 Vitals:   09/16/22 0949  BP: (!) 171/95  Pulse: 66  Resp: 20  Temp: 98.4 F (36.9 C)  SpO2: 99%     FHT:147 bpm Lab orders placed from triage:  UA

## 2022-09-16 NOTE — Progress Notes (Signed)
EFM placed on "standby" & BP cuff removed by Webb Silversmith, NT to perform EKG unbeknownst to this RN.  RN noticed FHR  and BP's not being monitored and reapplied the BP cuff and released EFM from "hold/standby" function.  RN spoke to NT, NT states she forgot to reapply BP cuff & release EFM from standby mode.

## 2022-09-16 NOTE — H&P (Signed)
History     CSN: [id number]  Arrival date and time: 09/16/22 2542   Event Date/Time   First Provider Initiated Contact with Patient 09/16/22 1007      Chief Complaint  Patient presents with   BP Evaluation   Misty Wood is a 26 y.o. year old G28P0000 female at 64w2dweeks gestation who presents to MAU reporting her BPs last night and this morning at home were "really high." She reports a sharp, intermittent RUQ "under my RT rib" earlier this morning. That pain resolved after drinking water and "rubbing it out." She reports constant, middle of the chest tightness; rated 6/10. She reports mild bilateral pedal edema. She denies H/A or visual disturbances. Her pregnancy is complicated by FGR (EFW <<7%, marginal cord insertion and now gHTN. [facility]; next appt with MFM is 09/18/2022 and [facility] is 10/01/2022.   OB History     Gravida  1   Para  0   Term  0   Preterm  0   AB  0   Living  0      SAB  0   IAB  0   Ectopic  0   Multiple  0   Live Births  0           Past Medical History:  Diagnosis Date   Anxiety    Asthma    Depression     Past Surgical History:  Procedure Laterality Date   NO PAST SURGERIES      Family History  Problem Relation Age of Onset   Asthma Mother    Alcoholism Father    Kidney Stones Father    Stroke Maternal Grandfather    Hypertension Maternal Grandfather    Kidney failure Paternal Grandfather    Cancer Other    Birth defects Neg Hx    Diabetes Neg Hx    Heart disease Neg Hx     Social History   Tobacco Use   Smoking status: Never   Smokeless tobacco: Never  Vaping Use   Vaping Use: Former  Substance Use Topics   Alcohol use: Not Currently    Comment: occasionally   Drug use: Not Currently    Types: Marijuana    Comment: prior to pregnancy    Allergies:  Allergies  Allergen Reactions   Penicillins Hives and Other (See Comments)    Has patient had a PCN reaction causing  immediate rash, facial/tongue/throat swelling, SOB or lightheadedness with hypotension: Yes Has patient had a PCN reaction causing severe rash involving mucus membranes or skin necrosis: No Has patient had a PCN reaction that required hospitalization No Has patient had a PCN reaction occurring within the last 10 years: No If all of the above answers are "NO", then may proceed with Cephalosporin use.    Medications Prior to Admission  Medication Sig Dispense Refill Last Dose   albuterol (PROVENTIL HFA;VENTOLIN HFA) 108 (90 Base) MCG/ACT inhaler Inhale 2 puffs into the lungs every 6 (six) hours as needed for wheezing or shortness of breath. 1 Inhaler 2 09/16/2022 at 0500   Prenatal Vit-Fe Fumarate-FA (PRENATAL MULTIVITAMIN) TABS tablet Take 1 tablet by mouth daily at 12 noon.   09/16/2022 at 0830   Blood Pressure Monitoring (BLOOD PRESSURE KIT) DEVI 1 Device by Does not apply route once a week. (Patient not taking: Reported on 08/14/2022) 1 each 0     Review of Systems  Constitutional: Negative.   HENT: Negative.  Eyes: Negative.   Respiratory:  Positive for chest tightness (middle of chest).   Cardiovascular:  Negative for leg swelling ("both feet were swollen last night").  Genitourinary: Negative.   Musculoskeletal: Negative.   Skin: Negative.   Allergic/Immunologic: Negative.   Neurological: Negative.   Hematological: Negative.   Psychiatric/Behavioral: Negative.     Physical Exam   Patient Vitals for the past 24 hrs:  BP Temp Pulse Resp SpO2 Height Weight  09/16/22 1436 130/77 -- 75 18 98 % -- --  09/16/22 1306 -- -- -- -- 100 % -- --  09/16/22 1300 (!) 149/80 -- 66 -- 100 % -- --  09/16/22 1245 (!) 153/85 -- 65 -- 100 % -- --  09/16/22 1230 (!) 144/88 -- 73 -- 100 % -- --  09/16/22 1215 135/80 -- 70 -- 100 % -- --  09/16/22 1200 (!) 143/88 -- 67 -- 100 % -- --  09/16/22 1130 (!) 151/88 -- 64 -- 100 % -- --  09/16/22 1115 (!) 137/91 -- 73 -- 100 % -- --  09/16/22 1104 (!)  146/94 -- 67 -- 99 % -- --  09/16/22 1030 (!) 142/79 -- 68 -- -- -- --  09/16/22 1015 (!) 148/97 -- 78 -- 100 % -- --  09/16/22 1005 -- -- -- -- 100 % -- --  09/16/22 1000 (!) 161/94 -- 66 -- 100 % -- --  09/16/22 0949 (!) 171/95 98.4 F (36.9 C) 66 20 99 % _0  (1.6 m) 90 kg    Physical Exam Vitals and nursing note reviewed. Exam conducted with a chaperone present.  Constitutional:      Appearance: Normal appearance. She is obese.  HENT:     Head: Normocephalic and atraumatic.     Nose: Nose normal.     Mouth/Throat:     Mouth: Mucous membranes are moist.  Eyes:     Extraocular Movements: Extraocular movements intact.     Pupils: Pupils are equal, round, and reactive to light.  Cardiovascular:     Rate and Rhythm: Normal rate.     Pulses: Normal pulses.     Heart sounds: Normal heart sounds.  Pulmonary:     Effort: Pulmonary effort is normal.     Breath sounds: Normal breath sounds.  Abdominal:     General: Bowel sounds are normal.     Palpations: Abdomen is soft.  Genitourinary:    Comments: deferred Musculoskeletal:        General: Normal range of motion.     Cervical back: Normal range of motion.  Skin:    General: Skin is warm and dry.  Neurological:     Mental Status: She is alert and oriented to person, place, and time.  Psychiatric:        Mood and Affect: Mood normal.        Behavior: Behavior normal.        Thought Content: Thought content normal.        Judgment: Judgment normal.    REACTIVE NST - FHR: 135 bpm / minimal variability / accels present / decels absent / TOCO: none MAU Course  Procedures  MDM CCUA CBC CMP P/C Ratio Serial BP's  Insert Saline lock Labetalol Protocol  Results for orders placed or performed during the hospital encounter of 09/16/22 (from the past 24 hour(s))  Protein / creatinine ratio, urine     Status: Abnormal   Collection Time: 09/16/22  9:44 AM  Result Value Ref Range  Creatinine, Urine 30 mg/dL   Total  Protein, Urine 25 mg/dL   Protein Creatinine Ratio 0.83 (H) 0.00 - 0.15 mg/mg[Cre]  CBC     Status: Abnormal   Collection Time: 09/16/22 10:07 AM  Result Value Ref Range   WBC 9.2 4.0 - 10.5 K/uL   RBC 3.20 (L) 3.87 - 5.11 MIL/uL   Hemoglobin 9.8 (L) 12.0 - 15.0 g/dL   HCT 30.0 (L) 36.0 - 46.0 %   MCV 93.8 80.0 - 100.0 fL   MCH 30.6 26.0 - 34.0 pg   MCHC 32.7 30.0 - 36.0 g/dL   RDW 13.6 11.5 - 15.5 %   Platelets 167 150 - 400 K/uL   nRBC 1.0 (H) 0.0 - 0.2 %  Comprehensive metabolic panel     Status: Abnormal   Collection Time: 09/16/22 10:07 AM  Result Value Ref Range   Sodium 136 135 - 145 mmol/L   Potassium 4.3 3.5 - 5.1 mmol/L   Chloride 110 98 - 111 mmol/L   CO2 19 (L) 22 - 32 mmol/L   Glucose, Bld 87 70 - 99 mg/dL   BUN 13 6 - 20 mg/dL   Creatinine, Ser 0.80 0.44 - 1.00 mg/dL   Calcium 8.8 (L) 8.9 - 10.3 mg/dL   Total Protein 5.9 (L) 6.5 - 8.1 g/dL   Albumin 2.5 (L) 3.5 - 5.0 g/dL   AST 22 15 - 41 U/L   ALT 17 0 - 44 U/L   Alkaline Phosphatase 117 38 - 126 U/L   Total Bilirubin 0.1 (L) 0.3 - 1.2 mg/dL   GFR, Estimated >60 >60 mL/min   Anion gap 7 5 - 15     *Consult with Dr. Damita Dunnings @ 1248 - notified of patient's complaints, assessments, lab & U/S results, recommended tx plan admit to OBSCU and BMZ 12 mg q 24 hrs x 2 doses. WIll hold off on MgSO4 at this time.   Assessment and Plan  1. Pre-eclampsia in third trimester - Admit to OBSCU - BMZ 12 mg IM injection q 24 hrs x 2 doses - Routine OSCU orders - Dr. Damita Dunnings assumes care of patient upon admission  2. Fetal growth restriction antepartum  3. [redacted] weeks gestation of pregnancy   Misty Wood, CNM 09/16/2022, 10:07 AM

## 2022-09-17 ENCOUNTER — Inpatient Hospital Stay (HOSPITAL_COMMUNITY): Payer: Medicaid Other | Admitting: Anesthesiology

## 2022-09-17 ENCOUNTER — Inpatient Hospital Stay (HOSPITAL_BASED_OUTPATIENT_CLINIC_OR_DEPARTMENT_OTHER): Payer: Medicaid Other

## 2022-09-17 DIAGNOSIS — O1493 Unspecified pre-eclampsia, third trimester: Secondary | ICD-10-CM | POA: Diagnosis not present

## 2022-09-17 DIAGNOSIS — O0933 Supervision of pregnancy with insufficient antenatal care, third trimester: Secondary | ICD-10-CM

## 2022-09-17 DIAGNOSIS — O365930 Maternal care for other known or suspected poor fetal growth, third trimester, not applicable or unspecified: Secondary | ICD-10-CM

## 2022-09-17 DIAGNOSIS — Z3A32 32 weeks gestation of pregnancy: Secondary | ICD-10-CM

## 2022-09-17 DIAGNOSIS — O43193 Other malformation of placenta, third trimester: Secondary | ICD-10-CM | POA: Diagnosis not present

## 2022-09-17 LAB — CBC
HCT: 29.5 % — ABNORMAL LOW (ref 36.0–46.0)
HCT: 29 % — ABNORMAL LOW (ref 36.0–46.0)
Hemoglobin: 9.7 g/dL — ABNORMAL LOW (ref 12.0–15.0)
Hemoglobin: 10.3 g/dL — ABNORMAL LOW (ref 12.0–15.0)
MCH: 30.6 pg (ref 26.0–34.0)
MCH: 32.2 pg (ref 26.0–34.0)
MCHC: 32.9 g/dL (ref 30.0–36.0)
MCHC: 35.5 g/dL (ref 30.0–36.0)
MCV: 93.1 fL (ref 80.0–100.0)
MCV: 90.6 fL (ref 80.0–100.0)
Platelets: 183 10*3/uL (ref 150–400)
Platelets: 81 10*3/uL — ABNORMAL LOW (ref 150–400)
RBC: 3.17 MIL/uL — ABNORMAL LOW (ref 3.87–5.11)
RBC: 3.2 MIL/uL — ABNORMAL LOW (ref 3.87–5.11)
RDW: 13.6 % (ref 11.5–15.5)
RDW: 14.3 % (ref 11.5–15.5)
WBC: 14.7 10*3/uL — ABNORMAL HIGH (ref 4.0–10.5)
WBC: 12.6 10*3/uL — ABNORMAL HIGH (ref 4.0–10.5)
nRBC: 0.6 % — ABNORMAL HIGH (ref 0.0–0.2)
nRBC: 1.7 % — ABNORMAL HIGH (ref 0.0–0.2)

## 2022-09-17 LAB — TYPE AND SCREEN
ABO/RH(D): A POS
Antibody Screen: NEGATIVE

## 2022-09-17 LAB — DIC (DISSEMINATED INTRAVASCULAR COAGULATION)PANEL
D-Dimer, Quant: 4.57 ug/mL-FEU — ABNORMAL HIGH (ref 0.00–0.50)
Fibrinogen: 324 mg/dL (ref 210–475)
INR: 1.2 (ref 0.8–1.2)
Platelets: 79 10*3/uL — ABNORMAL LOW (ref 150–400)
Prothrombin Time: 14.9 seconds (ref 11.4–15.2)
Smear Review: NONE SEEN
aPTT: 26 seconds (ref 24–36)

## 2022-09-17 LAB — COMPREHENSIVE METABOLIC PANEL
ALT: 18 U/L (ref 0–44)
AST: 22 U/L (ref 15–41)
Albumin: 2.5 g/dL — ABNORMAL LOW (ref 3.5–5.0)
Alkaline Phosphatase: 113 U/L (ref 38–126)
Anion gap: 12 (ref 5–15)
BUN: 12 mg/dL (ref 6–20)
CO2: 16 mmol/L — ABNORMAL LOW (ref 22–32)
Calcium: 8.9 mg/dL (ref 8.9–10.3)
Chloride: 107 mmol/L (ref 98–111)
Creatinine, Ser: 0.68 mg/dL (ref 0.44–1.00)
GFR, Estimated: >60 mL/min (ref >60)
Glucose, Bld: 108 mg/dL — ABNORMAL HIGH (ref 70–99)
Potassium: 4.3 mmol/L (ref 3.5–5.1)
Sodium: 135 mmol/L (ref 135–145)
Total Bilirubin: <0.1 mg/dL — ABNORMAL LOW (ref 0.3–1.2)
Total Protein: 6 g/dL — ABNORMAL LOW (ref 6.5–8.1)

## 2022-09-17 LAB — RPR: RPR Ser Ql: NONREACTIVE

## 2022-09-17 LAB — HIV ANTIBODY (ROUTINE TESTING W REFLEX): HIV Screen 4th Generation wRfx: NONREACTIVE

## 2022-09-17 MED ORDER — LACTATED RINGERS IV SOLN: 500.0000 mL | Freq: Once | INTRAVENOUS | Status: DC

## 2022-09-17 MED ORDER — TERBUTALINE SULFATE 1 MG/ML IJ SOLN: 0.2500 mg | Freq: Once | INTRAMUSCULAR | Status: DC | PRN

## 2022-09-17 MED ORDER — OXYTOCIN-SODIUM CHLORIDE 30-0.9 UT/500ML-% IV SOLN
2.5000 [IU]/h | INTRAVENOUS | Status: DC
  Filled 2022-09-17: qty 500

## 2022-09-17 MED ORDER — ONDANSETRON HCL 4 MG/2ML IJ SOLN: 4.0000 mg | Freq: Four times a day (QID) | INTRAMUSCULAR | Status: DC | PRN

## 2022-09-17 MED ORDER — EPHEDRINE 5 MG/ML INJ: 10.0000 mg | INTRAVENOUS | Status: DC | PRN

## 2022-09-17 MED ORDER — PHENYLEPHRINE 80 MCG/ML (10ML) SYRINGE FOR IV PUSH (FOR BLOOD PRESSURE SUPPORT): 80.0000 ug | PREFILLED_SYRINGE | INTRAVENOUS | Status: DC | PRN

## 2022-09-17 MED ORDER — MAGNESIUM SULFATE BOLUS VIA INFUSION
6.0000 g | Freq: Once | INTRAVENOUS | Status: AC
  Administered 2022-09-17: 6 g via INTRAVENOUS
  Filled 2022-09-17: qty 1000

## 2022-09-17 MED ORDER — LACTATED RINGERS IV SOLN
500.0000 mL | INTRAVENOUS | Status: DC | PRN
  Administered 2022-09-17 – 2022-09-18 (×2): 250 mL via INTRAVENOUS

## 2022-09-17 MED ORDER — LACTATED RINGERS AMNIOINFUSION
INTRAVENOUS | Status: DC
  Filled 2022-09-17 (×3): qty 1000

## 2022-09-17 MED ORDER — DIPHENHYDRAMINE HCL 50 MG/ML IJ SOLN: 12.5000 mg | INTRAMUSCULAR | Status: DC | PRN

## 2022-09-17 MED ORDER — SOD CITRATE-CITRIC ACID 500-334 MG/5ML PO SOLN: 30.0000 mL | ORAL | Status: DC | PRN

## 2022-09-17 MED ORDER — CYCLOBENZAPRINE HCL 10 MG PO TABS
10.0000 mg | ORAL_TABLET | Freq: Three times a day (TID) | ORAL | Status: DC | PRN
  Administered 2022-09-17: 10 mg via ORAL
  Filled 2022-09-17: qty 1

## 2022-09-17 MED ORDER — OXYCODONE-ACETAMINOPHEN 5-325 MG PO TABS: 2.0000 | ORAL_TABLET | ORAL | Status: DC | PRN

## 2022-09-17 MED ORDER — LEVETIRACETAM IN NACL 500 MG/100ML IV SOLN
500.0000 mg | Freq: Two times a day (BID) | INTRAVENOUS | Status: AC
  Administered 2022-09-18 – 2022-09-19 (×4): 500 mg via INTRAVENOUS
  Filled 2022-09-17 (×4): qty 100

## 2022-09-17 MED ORDER — FENTANYL CITRATE (PF) 100 MCG/2ML IJ SOLN
50.0000 ug | Freq: Once | INTRAMUSCULAR | Status: AC
  Administered 2022-09-17: 50 ug via INTRAVENOUS
  Filled 2022-09-17: qty 2

## 2022-09-17 MED ORDER — FENTANYL-BUPIVACAINE-NACL 0.5-0.125-0.9 MG/250ML-% EP SOLN: 12.0000 mL/h | EPIDURAL | Status: DC | PRN

## 2022-09-17 MED ORDER — MAGNESIUM SULFATE 40 GM/1000ML IV SOLN
2.0000 g/h | INTRAVENOUS | Status: DC
  Administered 2022-09-17: 2 g/h via INTRAVENOUS
  Filled 2022-09-17: qty 1000

## 2022-09-17 MED ORDER — LACTATED RINGERS IV SOLN: INTRAVENOUS | Status: DC

## 2022-09-17 MED ORDER — LEVETIRACETAM 500 MG PO TABS
500.0000 mg | ORAL_TABLET | Freq: Two times a day (BID) | ORAL | Status: DC
  Filled 2022-09-17: qty 1

## 2022-09-17 MED ORDER — ACETAMINOPHEN 325 MG PO TABS: 650.0000 mg | ORAL_TABLET | ORAL | Status: DC | PRN

## 2022-09-17 MED ORDER — TRANEXAMIC ACID-NACL 1000-0.7 MG/100ML-% IV SOLN
1000.0000 mg | INTRAVENOUS | Status: AC
  Administered 2022-09-18: 1000 mg via INTRAVENOUS
  Filled 2022-09-17: qty 100

## 2022-09-17 MED ORDER — FENTANYL-BUPIVACAINE-NACL 0.5-0.125-0.9 MG/250ML-% EP SOLN
EPIDURAL | Status: AC
  Filled 2022-09-17: qty 250

## 2022-09-17 MED ORDER — CEFAZOLIN SODIUM-DEXTROSE 1-4 GM/50ML-% IV SOLN
1.0000 g | Freq: Three times a day (TID) | INTRAVENOUS | Status: DC
  Administered 2022-09-17: 1 g via INTRAVENOUS
  Filled 2022-09-17 (×3): qty 50

## 2022-09-17 MED ORDER — OXYCODONE-ACETAMINOPHEN 5-325 MG PO TABS: 1.0000 | ORAL_TABLET | ORAL | Status: DC | PRN

## 2022-09-17 MED ORDER — CEFAZOLIN SODIUM-DEXTROSE 2-4 GM/100ML-% IV SOLN
2.0000 g | Freq: Once | INTRAVENOUS | Status: AC
  Administered 2022-09-17: 2 g via INTRAVENOUS
  Filled 2022-09-17: qty 100

## 2022-09-17 MED ORDER — ONDANSETRON HCL 4 MG/2ML IJ SOLN
4.0000 mg | Freq: Four times a day (QID) | INTRAMUSCULAR | Status: DC
  Administered 2022-09-17: 4 mg via INTRAVENOUS
  Filled 2022-09-17: qty 2

## 2022-09-17 MED ORDER — LIDOCAINE HCL (PF) 1 % IJ SOLN
INTRAMUSCULAR | Status: DC | PRN
  Administered 2022-09-17: 8 mL via EPIDURAL

## 2022-09-17 MED ORDER — OXYTOCIN BOLUS FROM INFUSION
333.0000 mL | Freq: Once | INTRAVENOUS | Status: AC
  Administered 2022-09-18: 333 mL via INTRAVENOUS

## 2022-09-17 MED ORDER — FENTANYL-BUPIVACAINE-NACL 0.5-0.125-0.9 MG/250ML-% EP SOLN
12.0000 mL/h | EPIDURAL | Status: DC | PRN
  Administered 2022-09-17: 12 mL/h via EPIDURAL

## 2022-09-17 MED ORDER — LACTATED RINGERS IV BOLUS
1000.0000 mL | Freq: Once | INTRAVENOUS | Status: AC
  Administered 2022-09-17: 1000 mL via INTRAVENOUS

## 2022-09-17 MED ORDER — OXYTOCIN-SODIUM CHLORIDE 30-0.9 UT/500ML-% IV SOLN
1.0000 m[IU]/min | INTRAVENOUS | Status: DC
  Administered 2022-09-17 – 2022-09-18 (×2): 1 m[IU]/min via INTRAVENOUS

## 2022-09-17 MED ORDER — LIDOCAINE HCL (PF) 1 % IJ SOLN: 30.0000 mL | INTRAMUSCULAR | Status: DC | PRN

## 2022-09-17 NOTE — Progress Notes (Signed)
Labor Progress Note Misty Wood is a 26 y.o. G1P0000 at [redacted]w[redacted]d who was transferred to labor and delivery from [facility] specialty care earlier today. History of pre-eclampsia with severe features, now with abruption diagnosed this morning. Also has severe FGR with EFW ~1%ile.  S: she is doing well. Noted to have recurrent late decels. Was previously on pitocin 10 units. Pitocin has now been discontinued.  O:  BP 138/76   Pulse 78   Temp 98.9 F (37.2 C) (Oral)   Resp 18   Ht 5\' 3"  (1.6 m)   Wt 90 kg   LMP  (LMP Unknown)   SpO2 99%   BMI 35.14 kg/m   EFM: 120 bpm / moderate variability / no accels / recurrent late decels.   CVE: Dilation: 8 Effacement (%): 90 Station: 0 Presentation: Vertex Exam by:: Dr. [email or web address]   A&P: 26 y.o. G1P0000 [redacted]w[redacted]d with IOL for severe pre-eclampsia, FGR 1%ile, abruption. #Labor: Progressing well. Now 8cm. AROM performed with clear/slightly blood tinge fluid. Pitocin stopped. NICU has consulted with patient Stop mag sulf per Dr [redacted]w[redacted]d, plan for keppra post partum #Pain: Epidural in place #FWB: Cat 2 due to recurrent late decels #GBS unknown - s/p ancef X 2 doses  Pre-eclampsia with SF -  recent labs show low platelets. She is asymptomatic. Blood pressures well controlled at this time. Mg sulf stopped, per Dr Despina Hidden. Keppra ordered for post partum. Continue close monitoring.   Despina Hidden MD MPH OB Fellow, [facility], Center for [facility] 09/17/2022

## 2022-09-17 NOTE — Anesthesia Procedure Notes (Signed)
Epidural Patient location during procedure: OB Start time: 09/17/2022 12:03 PM End time: 09/17/2022 12:10 PM  Staffing Anesthesiologist: Bethena Midget, MD  Preanesthetic Checklist Completed: patient identified, IV checked, site marked, risks and benefits discussed, surgical consent, monitors and equipment checked, pre-op evaluation and timeout performed  Epidural Patient position: sitting Prep: DuraPrep and site prepped and draped Patient monitoring: continuous pulse ox and blood pressure Approach: midline Location: L3-L4 Injection technique: LOR air  Needle:  Needle type: Tuohy  Needle gauge: 17 G Needle length: 9 cm and 9 Needle insertion depth: 6 cm Catheter type: closed end flexible Catheter size: 19 Gauge Catheter at skin depth: 12 cm Test dose: negative  Assessment Events: blood not aspirated, no cerebrospinal fluid, injection not painful, no injection resistance, no paresthesia and negative IV test

## 2022-09-17 NOTE — Progress Notes (Shared)
Labor Progress Note Misty Wood is a 26 y.o. G1P0000 at [redacted]w[redacted]d who initially presented for pre E and is being transferred from [facility] specialty care. S:   O:  BP (!) 157/101 (BP Location: Left Arm)   Pulse 95   Temp 98.2 F (36.8 C) (Oral)   Resp 16   Ht 5\' 3"  (1.6 m)   Wt 90 kg   LMP  (LMP Unknown)   SpO2 100%   BMI 35.14 kg/m  EFM: 130bpm/moderate var/+15x15 accel, no decel  CVE:     A&P: 26 y.o. G1P0000 [redacted]w[redacted]d admitted for pre E management. Now s/p BMX x2. Pregnancy notable for FGR (1%) with normal dopplers. Last growth was 11/30 and was <1%.  #Labor: Expectant management. #Pain: IV pain meds. Flexeril. Could consider GI cocktail. Epidural now placed.  #FWB: Category 1 #GBS not done  #Pre E with severe features: now with severe range blood pressures. Starting magnesium.   Ilyas Lipsitz N. 12/30, MD 11:34 AM

## 2022-09-17 NOTE — Anesthesia Preprocedure Evaluation (Signed)
Anesthesia Evaluation  Patient identified by MRN, date of birth, ID band Patient awake    Reviewed: Allergy & Precautions, H&P , NPO status , Patient's Chart, lab work & pertinent test results, reviewed documented beta blocker date and time   Airway Mallampati: I  TM Distance: >3 FB Neck ROM: full    Dental no notable dental hx. (+) Teeth Intact, Dental Advisory Given   Pulmonary neg pulmonary ROS, asthma    Pulmonary exam normal breath sounds clear to auscultation       Cardiovascular hypertension, Pt. on medications negative cardio ROS Normal cardiovascular exam Rhythm:regular Rate:Normal     Neuro/Psych  PSYCHIATRIC DISORDERS Anxiety Depression    negative neurological ROS  negative psych ROS   GI/Hepatic negative GI ROS, Neg liver ROS,,,  Endo/Other  negative endocrine ROS    Renal/GU negative Renal ROS  negative genitourinary   Musculoskeletal   Abdominal   Peds  Hematology negative hematology ROS (+)   Anesthesia Other Findings   Reproductive/Obstetrics (+) Pregnancy                             Anesthesia Physical Anesthesia Plan  ASA: 3  Anesthesia Plan: Epidural   Post-op Pain Management:    Induction:   PONV Risk Score and Plan: Treatment may vary due to age or medical condition  Airway Management Planned: Natural Airway  Additional Equipment: None  Intra-op Plan:   Post-operative Plan:   Informed Consent: I have reviewed the patients History and Physical, chart, labs and discussed the procedure including the risks, benefits and alternatives for the proposed anesthesia with the patient or authorized representative who has indicated his/her understanding and acceptance.       Plan Discussed with: Anesthesiologist  Anesthesia Plan Comments:        Anesthesia Quick Evaluation

## 2022-09-17 NOTE — Progress Notes (Signed)
[facility] COMPREHENSIVE PROGRESS NOTE  Misty Wood is a 26 y.o. G1P0000 at [redacted]w[redacted]d who is admitted for Preeclampsia in s/o FGR with normal dopplers.  Estimated Date of Delivery: 11/09/22 Fetal presentation is cephalic as of [id number].  Length of Stay:  1 Days. Admitted 09/16/2022  Subjective: Pt was doing well overnight but then developed acute onset right sided pain that was unrelenting and severe with waves of it worsening. She feels like it is in her her abdomen and not her back.   Patient reports good fetal movement.  She reports no uterine contractions, no bleeding and no loss of fluid per vagina.  Vitals:  Blood pressure (!) 157/101, pulse 95, temperature 98.2 F (36.8 C), temperature source Oral, resp. rate 16, height 5\' 3"  (1.6 m), weight 90 kg, SpO2 100 %. Physical Examination: CONSTITUTIONAL: Well-developed, well-nourished female in moderate distress.  NEUROLOGIC: Alert and oriented to person, place, and time. No cranial nerve deficit noted. PSYCHIATRIC: Normal mood and affect. Normal behavior. Normal judgment and thought content. CARDIOVASCULAR: Normal heart rate noted, regular rhythm RESPIRATORY: Effort and breath sounds normal, no problems with respiration noted MUSCULOSKELETAL: Normal range of motion. No edema and no tenderness. 2+ distal pulses. ABDOMEN: Soft, nondistended, gravid. Mild increase in pain from palpation in RUQ, no pain at fundus CERVIX:  Not examined  Fetal monitoring: FHR: 130 bpm, Variability: moderate, Accelerations: Present, Decelerations: Absent  Uterine activity: No contractions per hour  Results for orders placed or performed during the hospital encounter of 09/16/22 (from the past 48 hour(s))  Protein / creatinine ratio, urine     Status: Abnormal   Collection Time: 09/16/22  9:44 AM  Result Value Ref Range   Creatinine, Urine 30 mg/dL   Total Protein, Urine 25 mg/dL    Comment: NO NORMAL RANGE ESTABLISHED FOR THIS TEST   Protein  Creatinine Ratio 0.83 (H) 0.00 - 0.15 mg/mg[Cre]    Comment: Performed at [facility], [place] [address] [place], [place] [address]  CBC     Status: Abnormal   Collection Time: 09/16/22 10:07 AM  Result Value Ref Range   WBC 9.2 4.0 - 10.5 K/uL   RBC 3.20 (L) 3.87 - 5.11 MIL/uL   Hemoglobin 9.8 (L) 12.0 - 15.0 g/dL   HCT 30.0 (L) 36.0 - 46.0 %   MCV 93.8 80.0 - 100.0 fL   MCH 30.6 26.0 - 34.0 pg   MCHC 32.7 30.0 - 36.0 g/dL   RDW 13.6 11.5 - 15.5 %   Platelets 167 150 - 400 K/uL   nRBC 1.0 (H) 0.0 - 0.2 %    Comment: Performed at [facility], [place] [address] [place], [place] [address]  Comprehensive metabolic panel     Status: Abnormal   Collection Time: 09/16/22 10:07 AM  Result Value Ref Range   Sodium 136 135 - 145 mmol/L   Potassium 4.3 3.5 - 5.1 mmol/L   Chloride 110 98 - 111 mmol/L   CO2 19 (L) 22 - 32 mmol/L   Glucose, Bld 87 70 - 99 mg/dL    Comment: Glucose reference range applies only to samples taken after fasting for at least 8 hours.   BUN 13 6 - 20 mg/dL   Creatinine, Ser 0.80 0.44 - 1.00 mg/dL   Calcium 8.8 (L) 8.9 - 10.3 mg/dL   Total Protein 5.9 (L) 6.5 - 8.1 g/dL   Albumin 2.5 (L) 3.5 - 5.0 g/dL   AST 22 15 - 41 U/L  ALT 17 0 - 44 U/L   Alkaline Phosphatase 117 38 - 126 U/L   Total Bilirubin 0.1 (L) 0.3 - 1.2 mg/dL   GFR, Estimated >60 >60 mL/min    Comment: (NOTE) Calculated using the CKD-EPI Creatinine Equation (2021)    Anion gap 7 5 - 15    Comment: Performed at [facility] [address] [place], [place] [address]  CBC     Status: Abnormal   Collection Time: 09/17/22  8:53 AM  Result Value Ref Range   WBC 14.7 (H) 4.0 - 10.5 K/uL   RBC 3.17 (L) 3.87 - 5.11 MIL/uL   Hemoglobin 9.7 (L) 12.0 - 15.0 g/dL   HCT 29.5 (L) 36.0 - 46.0 %   MCV 93.1 80.0 - 100.0 fL   MCH 30.6 26.0 - 34.0 pg   MCHC 32.9 30.0 - 36.0 g/dL   RDW 13.6 11.5 - 15.5 %   Platelets 183 150 - 400 K/uL   nRBC 0.6 (H) 0.0 - 0.2 %    Comment: Performed at  [facility], [place] [address], [place], [place] [address]  Comprehensive metabolic panel     Status: Abnormal   Collection Time: 09/17/22  8:53 AM  Result Value Ref Range   Sodium 135 135 - 145 mmol/L   Potassium 4.3 3.5 - 5.1 mmol/L   Chloride 107 98 - 111 mmol/L   CO2 16 (L) 22 - 32 mmol/L   Glucose, Bld 108 (H) 70 - 99 mg/dL    Comment: Glucose reference range applies only to samples taken after fasting for at least 8 hours.   BUN 12 6 - 20 mg/dL   Creatinine, Ser 0.68 0.44 - 1.00 mg/dL   Calcium 8.9 8.9 - 10.3 mg/dL   Total Protein 6.0 (L) 6.5 - 8.1 g/dL   Albumin 2.5 (L) 3.5 - 5.0 g/dL   AST 22 15 - 41 U/L   ALT 18 0 - 44 U/L   Alkaline Phosphatase 113 38 - 126 U/L   Total Bilirubin <0.1 (L) 0.3 - 1.2 mg/dL   GFR, Estimated >60 >60 mL/min    Comment: (NOTE) Calculated using the CKD-EPI Creatinine Equation (2021)    Anion gap 12 5 - 15    Comment: Performed at [facility], [place] [address] [place], [place] [address]    No results found.  Current scheduled medications  betamethasone acetate-betamethasone sodium phosphate  12 mg Intramuscular Q24 Hr x 2   ondansetron (ZOFRAN) IV  4 mg Intravenous Q6H   prenatal multivitamin  1 tablet Oral Q1200    I have reviewed the patient's current medications.  ASSESSMENT: Principal Problem:   Preeclampsia, third trimester   PLAN: Preeclampsia without severe features - Recheck labs wnl. Bps have been mild range without blood pressure medications. Continue to monitor another 24 hours through BMZ course  Right abdominal pain - Labs wnl which is reassuring for gall bladder, AFLP, HELLP - Fentanyl given for pain - Given acute onset pain in right side, will check stat US (and dopplers since just about due) to rule out concealed abruption - If work up is negative may be muscle spasm etiology. She had initial relief from fentanyl but this would not be an ongoing medication. Could benefit from Flexeril or GI cocktail.  -  Reviewed unlikely to be constipation related as this usually doesn't cause acute onset right sided pain  Fetal growth restriction/Fetal well being - Growth was last done 11/30 and  would not be due until 11/21. Continue weekly dopplers but checked those today instead of tomorrow since already going for [place].  - Continue NST BID - BMZ 1/2 given yesterday. Will complete course today  Routine postpartum care - Offer RSV vaccine tomorrow - Rh pos - Notes constipation in pregnancy but had bowel movement yesterday.    Continue routine antenatal care.   Milas Hock, MD, FACOG Obstetrician & Gynecologist, [facility] for [facility], [facility]

## 2022-09-17 NOTE — Consult Note (Signed)
.  Consultation Service: Neonatology   Dr. Damita Dunnings has asked for consultation on Misty Wood regarding the care of a premature infant at 32+[redacted] wks gestation . Thank you for inviting [place] to see this patient.   Reason for consult:  Explain the possible complications, the prognosis, and the care of a premature infant at 32+[redacted] wk gestation.   Chief complaint: 26 yo female with a single IUP at 32+ wk gA with Pre E, fetal IUGR, and new abruption discovered on ultrasound 12/13.   Severe IUGR, last EFW on 11/30 @ 1087 g<1%ile.  Based on the Pre E and now abruption, MFM recommendation is for delivery -> proceeding with IOL.    My key findings of this patient's HPI are:  I have reviewed the patient's chart and have met with Misty. The salient information is as follows:   Prenatal labs: reviewed   Prenatal care:   good Pregnancy complications:  pre-eclampsia, placental abruption, IUGR Maternal antibiotics: This patient's Wood is not on file. Maternal Steroids: BMZ course, 2nd dose 12/13 @ 1100   Most recent dose:  12/13 @ 1100      My recommendations for this patient and my actions included:   1. In the presence of the Misty Wood and Misty Wood, I spent 20 minutes discussing the possible complications and outcomes of prematurity at this gestational age.  I discussed the potential need for resuscitation at birth, mechanical ventilation and surfactant administration for respiratory distress, IV fluids pending establishment of enteral feeds (encouraged breast milk feeding to which she planned to do), antibiotics for possible sepsis, temperature support, and monitoring. I also discussed the LOW potential risk of complications such as intracranial hemorrhage, and chronic lung disease. I also discussed the potential length of stay in the neonatal intensive care unit for about 8 weeks. I discussed this with parents in detail and they expressed an understanding of the risks and complications of prematurity.    2. I also discussed the expected survival of an infant born at Gestational Age: <None>, which is near 100%. We further discussed that roughly <10% of neonates born at this age have severe neurological complications and that around <10% have school difficulties. She expressed an understanding of this information.   3. I informed Misty that the NICU team would be present at the delivery. She agreed that all appropriate medical measures could be taken to resuscitate Misty infant at the delivery. She also understood that depending on Misty infant's initial condition and NICU course, some difficult decisions may have to be made.   4. A visit to the NICU by the infant's Wood and/or a significant other was encouraged. Visitation policy was discussed.   Final Impression:  26 yo  female with a single IUP @ 32+3 wga who is undergoing IOL for Pre E and abruption, who now understands the possible complications and prognosis of Misty infant.   Total time spent in face to face consultation 20 minutes, + 25 minutes of chart review.   Misty Haggard, MD Neonatologist

## 2022-09-18 ENCOUNTER — Ambulatory Visit: Payer: Medicaid Other

## 2022-09-18 ENCOUNTER — Encounter (HOSPITAL_COMMUNITY): Payer: Self-pay | Admitting: Obstetrics and Gynecology

## 2022-09-18 DIAGNOSIS — O4423 Partial placenta previa NOS or without hemorrhage, third trimester: Secondary | ICD-10-CM

## 2022-09-18 DIAGNOSIS — O9912 Other diseases of the blood and blood-forming organs and certain disorders involving the immune mechanism complicating childbirth: Secondary | ICD-10-CM

## 2022-09-18 DIAGNOSIS — Z3A32 32 weeks gestation of pregnancy: Secondary | ICD-10-CM

## 2022-09-18 DIAGNOSIS — O365930 Maternal care for other known or suspected poor fetal growth, third trimester, not applicable or unspecified: Secondary | ICD-10-CM

## 2022-09-18 DIAGNOSIS — O4590 Premature separation of placenta, unspecified, unspecified trimester: Secondary | ICD-10-CM | POA: Diagnosis not present

## 2022-09-18 DIAGNOSIS — O1414 Severe pre-eclampsia complicating childbirth: Secondary | ICD-10-CM

## 2022-09-18 LAB — COMPREHENSIVE METABOLIC PANEL
ALT: 263 U/L — ABNORMAL HIGH (ref 0–44)
AST: 410 U/L — ABNORMAL HIGH (ref 15–41)
Albumin: 2.3 g/dL — ABNORMAL LOW (ref 3.5–5.0)
Alkaline Phosphatase: 105 U/L (ref 38–126)
Anion gap: 8 (ref 5–15)
BUN: 21 mg/dL — ABNORMAL HIGH (ref 6–20)
CO2: 18 mmol/L — ABNORMAL LOW (ref 22–32)
Calcium: 7.8 mg/dL — ABNORMAL LOW (ref 8.9–10.3)
Chloride: 107 mmol/L (ref 98–111)
Creatinine, Ser: 1.3 mg/dL — ABNORMAL HIGH (ref 0.44–1.00)
GFR, Estimated: 59 mL/min — ABNORMAL LOW (ref >60)
Glucose, Bld: 121 mg/dL — ABNORMAL HIGH (ref 70–99)
Potassium: 4.3 mmol/L (ref 3.5–5.1)
Sodium: 133 mmol/L — ABNORMAL LOW (ref 135–145)
Total Bilirubin: 1 mg/dL (ref 0.3–1.2)
Total Protein: 5.4 g/dL — ABNORMAL LOW (ref 6.5–8.1)

## 2022-09-18 LAB — DIC (DISSEMINATED INTRAVASCULAR COAGULATION)PANEL
D-Dimer, Quant: 5.84 ug/mL-FEU — ABNORMAL HIGH (ref 0.00–0.50)
D-Dimer, Quant: 4.52 ug/mL-FEU — ABNORMAL HIGH (ref 0.00–0.50)
Fibrinogen: 279 mg/dL (ref 210–475)
Fibrinogen: 305 mg/dL (ref 210–475)
INR: 1.4 — ABNORMAL HIGH (ref 0.8–1.2)
INR: 1.3 — ABNORMAL HIGH (ref 0.8–1.2)
Platelets: 34 10*3/uL — ABNORMAL LOW (ref 150–400)
Platelets: 34 10*3/uL — ABNORMAL LOW (ref 150–400)
Platelets: 28 10*3/uL — CL (ref 150–400)
Prothrombin Time: 16.9 seconds — ABNORMAL HIGH (ref 11.4–15.2)
Prothrombin Time: 16.3 seconds — ABNORMAL HIGH (ref 11.4–15.2)
aPTT: 33 seconds (ref 24–36)
aPTT: 31 seconds (ref 24–36)

## 2022-09-18 LAB — CBC
HCT: 23.6 % — ABNORMAL LOW (ref 36.0–46.0)
HCT: 24.1 % — ABNORMAL LOW (ref 36.0–46.0)
Hemoglobin: 8.2 g/dL — ABNORMAL LOW (ref 12.0–15.0)
Hemoglobin: 8.1 g/dL — ABNORMAL LOW (ref 12.0–15.0)
MCH: 31.1 pg (ref 26.0–34.0)
MCH: 30.7 pg (ref 26.0–34.0)
MCHC: 34.7 g/dL (ref 30.0–36.0)
MCHC: 33.6 g/dL (ref 30.0–36.0)
MCV: 89.4 fL (ref 80.0–100.0)
MCV: 91.3 fL (ref 80.0–100.0)
Platelets: 34 10*3/uL — ABNORMAL LOW (ref 150–400)
Platelets: 32 10*3/uL — ABNORMAL LOW (ref 150–400)
RBC: 2.64 MIL/uL — ABNORMAL LOW (ref 3.87–5.11)
RBC: 2.64 MIL/uL — ABNORMAL LOW (ref 3.87–5.11)
RDW: 15.3 % (ref 11.5–15.5)
RDW: 15.8 % — ABNORMAL HIGH (ref 11.5–15.5)
WBC: 11.4 10*3/uL — ABNORMAL HIGH (ref 4.0–10.5)
WBC: 10.6 10*3/uL — ABNORMAL HIGH (ref 4.0–10.5)
nRBC: 5.5 % — ABNORMAL HIGH (ref 0.0–0.2)
nRBC: 9.7 % — ABNORMAL HIGH (ref 0.0–0.2)

## 2022-09-18 MED ORDER — LABETALOL HCL 200 MG PO TABS
200.0000 mg | ORAL_TABLET | Freq: Three times a day (TID) | ORAL | Status: DC
  Administered 2022-09-18: 200 mg via ORAL
  Filled 2022-09-18: qty 1

## 2022-09-18 MED ORDER — MEASLES, MUMPS & RUBELLA VAC IJ SOLR: 0.5000 mL | Freq: Once | INTRAMUSCULAR | Status: DC

## 2022-09-18 MED ORDER — SIMETHICONE 80 MG PO CHEW: 80.0000 mg | CHEWABLE_TABLET | ORAL | Status: DC | PRN

## 2022-09-18 MED ORDER — SENNOSIDES-DOCUSATE SODIUM 8.6-50 MG PO TABS
2.0000 | ORAL_TABLET | ORAL | Status: DC
  Administered 2022-09-18 – 2022-09-22 (×5): 2 via ORAL
  Filled 2022-09-18 (×5): qty 2

## 2022-09-18 MED ORDER — OXYCODONE HCL 5 MG PO TABS
5.0000 mg | ORAL_TABLET | ORAL | Status: DC | PRN
  Administered 2022-09-18 – 2022-09-22 (×8): 5 mg via ORAL
  Filled 2022-09-18 (×8): qty 1

## 2022-09-18 MED ORDER — ZOLPIDEM TARTRATE 5 MG PO TABS: 5.0000 mg | ORAL_TABLET | Freq: Every evening | ORAL | Status: DC | PRN

## 2022-09-18 MED ORDER — ONDANSETRON HCL 4 MG PO TABS: 4.0000 mg | ORAL_TABLET | ORAL | Status: DC | PRN

## 2022-09-18 MED ORDER — DIPHENHYDRAMINE HCL 25 MG PO CAPS: 25.0000 mg | ORAL_CAPSULE | Freq: Four times a day (QID) | ORAL | Status: DC | PRN

## 2022-09-18 MED ORDER — ACETAMINOPHEN 325 MG PO TABS: 650.0000 mg | ORAL_TABLET | ORAL | Status: DC | PRN

## 2022-09-18 MED ORDER — PRENATAL MULTIVITAMIN CH
1.0000 | ORAL_TABLET | Freq: Every day | ORAL | Status: DC
  Administered 2022-09-20 – 2022-09-22 (×3): 1 via ORAL
  Filled 2022-09-18 (×3): qty 1

## 2022-09-18 MED ORDER — COCONUT OIL OIL: 1.0000 | TOPICAL_OIL | Status: DC | PRN

## 2022-09-18 MED ORDER — TETANUS-DIPHTH-ACELL PERTUSSIS 5-2.5-18.5 LF-MCG/0.5 IM SUSY
0.5000 mL | PREFILLED_SYRINGE | Freq: Once | INTRAMUSCULAR | Status: DC
  Filled 2022-09-18: qty 0.5

## 2022-09-18 MED ORDER — SODIUM CHLORIDE 0.9% FLUSH: 3.0000 mL | INTRAVENOUS | Status: DC | PRN

## 2022-09-18 MED ORDER — NIFEDIPINE ER OSMOTIC RELEASE 60 MG PO TB24
60.0000 mg | ORAL_TABLET | Freq: Every day | ORAL | Status: DC
  Administered 2022-09-19 – 2022-09-22 (×4): 60 mg via ORAL
  Filled 2022-09-18 (×4): qty 1

## 2022-09-18 MED ORDER — DIBUCAINE (PERIANAL) 1 % EX OINT: 1.0000 | TOPICAL_OINTMENT | CUTANEOUS | Status: DC | PRN

## 2022-09-18 MED ORDER — SODIUM CHLORIDE 0.9% FLUSH
3.0000 mL | Freq: Two times a day (BID) | INTRAVENOUS | Status: DC
  Administered 2022-09-18 – 2022-09-20 (×5): 3 mL via INTRAVENOUS

## 2022-09-18 MED ORDER — IBUPROFEN 600 MG PO TABS
600.0000 mg | ORAL_TABLET | Freq: Four times a day (QID) | ORAL | Status: DC
  Administered 2022-09-18: 600 mg via ORAL
  Filled 2022-09-18: qty 1

## 2022-09-18 MED ORDER — NIFEDIPINE ER OSMOTIC RELEASE 30 MG PO TB24
60.0000 mg | ORAL_TABLET | Freq: Once | ORAL | Status: AC
  Administered 2022-09-18: 60 mg via ORAL
  Filled 2022-09-18 (×2): qty 2

## 2022-09-18 MED ORDER — BENZOCAINE-MENTHOL 20-0.5 % EX AERO: 1.0000 | INHALATION_SPRAY | CUTANEOUS | Status: DC | PRN

## 2022-09-18 MED ORDER — HYDRALAZINE HCL 20 MG/ML IJ SOLN
20.0000 mg | Freq: Once | INTRAMUSCULAR | Status: AC
  Administered 2022-09-18: 20 mg via INTRAVENOUS

## 2022-09-18 MED ORDER — SODIUM CHLORIDE 0.9 % IV SOLN: INTRAVENOUS | Status: DC | PRN

## 2022-09-18 MED ORDER — WITCH HAZEL-GLYCERIN EX PADS: 1.0000 | MEDICATED_PAD | CUTANEOUS | Status: DC | PRN

## 2022-09-18 MED ORDER — OXYCODONE HCL 5 MG PO TABS
10.0000 mg | ORAL_TABLET | ORAL | Status: DC | PRN
  Administered 2022-09-20: 10 mg via ORAL
  Filled 2022-09-18: qty 2

## 2022-09-18 MED ORDER — FUROSEMIDE 20 MG PO TABS
20.0000 mg | ORAL_TABLET | Freq: Every day | ORAL | Status: DC
  Administered 2022-09-18 – 2022-09-22 (×5): 20 mg via ORAL
  Filled 2022-09-18 (×5): qty 1

## 2022-09-18 MED ORDER — ONDANSETRON HCL 4 MG/2ML IJ SOLN: 4.0000 mg | INTRAMUSCULAR | Status: DC | PRN

## 2022-09-18 NOTE — Anesthesia Postprocedure Evaluation (Signed)
Anesthesia Post Note  Patient: Misty Wood  Procedure(s) Performed: AN AD HOC LABOR EPIDURAL     Patient location during evaluation: OB High Risk Anesthesia Type: Epidural Level of consciousness: awake, oriented and awake and alert Pain management: pain level controlled Vital Signs Assessment: post-procedure vital signs reviewed and stable Respiratory status: spontaneous breathing, respiratory function stable and nonlabored ventilation Cardiovascular status: stable Postop Assessment: no headache, adequate PO intake, able to ambulate, patient able to bend at knees and no apparent nausea or vomiting Anesthetic complications: no   No notable events documented.  Last Vitals:  Vitals:   09/18/22 0706 09/18/22 1437  BP: 126/70 137/84  Pulse: 84 89  Resp:  17  Temp: 36.6 C 36.9 C  SpO2:  98%    Last Pain:  Vitals:   09/18/22 1437  TempSrc: Oral  PainSc:    Pain Goal:                   [name]

## 2022-09-18 NOTE — Progress Notes (Signed)
Latest Reference Range & Units 09/18/22 15:53  Platelets 150 - 400 K/uL 28 (LL)  (LL): Data is critically low  Report called to Dr. Tawanna Sat. No new orders given. Carmelina Dane, RN

## 2022-09-18 NOTE — Discharge Summary (Signed)
Postpartum Discharge Summary  Date of Service updated-12/18     Patient Name: Misty Wood DOB: 24-Apr-1996 MRN: [id number]  Date of admission: 09/16/2022 Delivery date:09/18/2022  Delivering provider: Stormy Card  Date of discharge: 09/22/2022  Admitting diagnosis: Preeclampsia, third trimester [O14.93] Gestational hypertension [O13.9] Intrauterine pregnancy: [redacted]w[redacted]d    Secondary diagnosis:  Principal Problem:   Preeclampsia, third trimester Active Problems:   Supervision of other normal pregnancy, antepartum   IUGR (intrauterine growth restriction) affecting care of mother   Preterm delivery   Vaginal delivery   Placental abruption  Additional problems: Thrombocytopenia    Discharge diagnosis: {DX.:23714}                                              Post partum procedures:{Postpartum procedures:23558} Augmentation: AROM and Pitocin Complications: Placental Abruption  Hospital course: Induction of Labor With Vaginal Delivery   26y.o. yo G1P0000 at 381w4das admitted to the hospital 09/16/2022 for induction of labor.  Indication for induction: pre-eclampsia with severe features, placenta abruption.  Patient had an labor course complicated by*** Membrane Rupture Time/Date: 8:58 PM ,09/17/2022   Delivery Method:Vaginal, Spontaneous  Episiotomy: None  Lacerations:  None  Details of delivery can be found in separate delivery note.  Patient had a postpartum course complicated by***. Patient is discharged home 09/22/22.  Newborn Data: Birth date:09/18/2022  Birth time:4:10 AM  Gender:Female  Living status:Living  Apgars:2 ,6  Weight:   Magnesium Sulfate received: Yes: Seizure prophylaxis BMZ received: Yes Rhophylac:N/A MMR:{MMR:30440033} T-DaP:{Tdap:23962} Flu: {F{KXF:81829}ransfusion:{Transfusion received:30440034}  Physical exam  Vitals:   09/21/22 1629 09/21/22 1947 09/21/22 2333 09/22/22 0358  BP: 116/64 131/66 120/65 (!) 115/56  Pulse: 89 (!)  102 96 89  Resp: _0 Temp: 98.3 F (36.8 C) 98.7 F (37.1 C) 97.6 F (36.4 C) 98.4 F (36.9 C)  TempSrc: Oral Oral Oral Oral  SpO2: 100% 100% 98% 100%  Weight:      Height:       General: {Exam; general:21111117} Lochia: {Desc; appropriate/inappropriate:30686::"appropriate"} Uterine Fundus: {Desc; firm/soft:30687} Incision: {Exam; incision:21111123} DVT Evaluation: {Exam; dvHBZ:1696789}abs: Lab Results  Component Value Date   WBC 16.8 (H) 09/22/2022   HGB 7.8 (L) 09/22/2022   HCT 24.4 (L) 09/22/2022   MCV 96.1 09/22/2022   PLT 77 (L) 09/22/2022      Latest Ref Rng & Units 09/21/2022   11:41 PM  CMP  Glucose 70 - 99 mg/dL 91   BUN 6 - 20 mg/dL 32   Creatinine 0.44 - 1.00 mg/dL 1.31   Sodium 135 - 145 mmol/L 133   Potassium 3.5 - 5.1 mmol/L 4.7   Chloride 98 - 111 mmol/L 100   CO2 22 - 32 mmol/L 23   Calcium 8.9 - 10.3 mg/dL 9.1   Total Protein 6.5 - 8.1 g/dL 6.2   Total Bilirubin 0.3 - 1.2 mg/dL 0.4   Alkaline Phos 38 - 126 U/L 105   AST 15 - 41 U/L 194   ALT 0 - 44 U/L 427    Edinburgh Score:    09/19/2022   11:34 AM  Edinburgh Postnatal Depression Scale Screening Tool  I have been able to laugh and see the funny side of things. 0  I have looked forward with enjoyment to things. 0  I have blamed myself unnecessarily when things  went wrong. 1  I have been anxious or worried for no good reason. 0  I have felt scared or panicky for no good reason. 0  Things have been getting on top of me. 1  I have been so unhappy that I have had difficulty sleeping. 1  I have felt sad or miserable. 0  I have been so unhappy that I have been crying. 0  The thought of harming myself has occurred to me. 0  Edinburgh Postnatal Depression Scale Total 3     After visit meds:  Allergies as of 09/22/2022       Reactions   Penicillins Hives, Other (See Comments)   Has patient had a PCN reaction causing immediate rash, facial/tongue/throat swelling, SOB or  lightheadedness with hypotension: Yes Has patient had a PCN reaction causing severe rash involving mucus membranes or skin necrosis: No Has patient had a PCN reaction that required hospitalization No Has patient had a PCN reaction occurring within the last 10 years: No If all of the above answers are "NO", then may proceed with Cephalosporin use.        Medication List     STOP taking these medications    Blood Pressure Kit Devi       TAKE these medications    acetaminophen 325 MG tablet Commonly known as: Tylenol Take 2 tablets (650 mg total) by mouth every 4 (four) hours as needed (for pain scale < 4).   albuterol 108 (90 Base) MCG/ACT inhaler Commonly known as: VENTOLIN HFA Inhale 2 puffs into the lungs every 6 (six) hours as needed for wheezing or shortness of breath.   docusate sodium 100 MG capsule Commonly known as: Colace Take 1 capsule (100 mg total) by mouth 2 (two) times daily.   furosemide 20 MG tablet Commonly known as: LASIX Take 1 tablet (20 mg total) by mouth daily for 2 days.   NIFEdipine 60 MG 24 hr tablet Commonly known as: ADALAT CC Take 1 tablet (60 mg total) by mouth daily.   oxyCODONE 5 MG immediate release tablet Commonly known as: Oxy IR/ROXICODONE Take 1 tablet (5 mg total) by mouth every 6 (six) hours as needed for up to 3 days (pain scale 4-7).   prenatal multivitamin Tabs tablet Take 1 tablet by mouth daily at 12 noon.         Discharge home in stable condition Infant Feeding: {Baby feeding:23562} Infant Disposition:{CHL IP OB HOME WITH JTTSVX:79390} Discharge instruction: per After Visit Summary and Postpartum booklet. Activity: Advance as tolerated. Pelvic rest for 6 weeks.  Diet: {OB ZESP:23300762} Future Appointments: Future Appointments  Date Time Provider [place]  09/25/2022 10:40 AM [place] None  11/07/2022  9:15 AM Constant, Vickii Chafe, MD [place] None   Follow up Visit:  [place]. Go to.   Specialty: Obstetrics and Gynecology Why: Please follow up in 1 wk for a BP check Contact information: [address], [place] [phone]                The following message was sent to [employer] by Mikki Santee, MD  Please schedule this patient for a In person postpartum visit in 4 weeks with the following provider: MD. Additional Postpartum F/U:BP check 1 week  High risk pregnancy complicated by: pre-eclampsia, severe FGR Delivery mode:  Vaginal, Spontaneous  Anticipated Birth Control:  Unsure   09/22/2022 Annalee Genta,  DO

## 2022-09-18 NOTE — Progress Notes (Signed)
Post Partum Day 0 s/p SVD in setting of abruption, preeclampsia with severe features (BP) Subjective: no complaints, tolerating PO, and minimal bleeding.   Objective: Blood pressure 137/84, pulse 89, temperature 98.5 F (36.9 C), temperature source Oral, resp. rate 17, height 5\' 3"  (1.6 m), weight 90 kg, SpO2 98 %, unknown if currently breastfeeding.  Physical Exam:  General: alert, cooperative, and no distress Lochia: appropriate Uterine Fundus: firm Incision: n/a Foley: Bloody urine (has been present since delivery) DVT Evaluation: No cords or calf tenderness.  Recent Labs    09/17/22 2002 09/18/22 0738  HGB 10.3* 8.2*  HCT 29.0* 23.6*    Assessment/Plan: Preeclampsia with severe features - This AM patient switched to Keppra from Magnesium due to concern about magnesium causing uterine atony and increasing her risk of bleeding once platelets were noted to be dropping.  - Recheck CMP at 4pm with other labs.  - Bps normotensive since delivery and she is on Nifed 60 - Lasix 20 daily started  Thrombocytopenia - Platelets appear to be stabilized at 34. Reviewed limits of platelet transfusions. Would only do if bleeding and drop lower. Currently only bleeding is some from her urine.  - Likely consumptive from borderline DIC. Fibrinogen is 279.  - Recheck DIC panel at 1600. Low threshold for FFP as well.   Anemia - Will recheck CBC today. Will have low threshold for transfusion. If stable in the 8's, then will do IV iron.   Postpartum - Contraception: Discuss on PPD1 - MOF: Breast - Rh status: Positive - Rubella status: Immune - Dispo: Possibly on PPD2 depending on heme parameters - Consults: Lactation, NICU  Neonatal - Doing well in NICU   LOS: 2 days   09/20/22 09/18/2022, 2:43 PM

## 2022-09-18 NOTE — Progress Notes (Signed)
Latest Reference Range & Units 09/18/22 07:38  Hemoglobin 12.0 - 15.0 g/dL 8.2 (L)  (L): Data is abnormally low   Latest Reference Range & Units 09/18/22 07:38  Platelets 150 - 400 K/uL 34 (L)  (L): Data is abnormally low  Results called to Dr. Para March. No new orders given. Will continue to monitor. Carmelina Dane, RN

## 2022-09-18 NOTE — Progress Notes (Signed)
Breast pump initiated. Pt educated with a return demonstration. Carmelina Dane, RN

## 2022-09-18 NOTE — Progress Notes (Signed)
Labor Progress Note DEBERAH ADOLF is a 26 y.o. G1P0000 at [redacted]w[redacted]d who was transferred to labor and delivery from [facility] specialty care earlier today. History of pre-eclampsia with severe features, now with abruption diagnosed this morning. Also has severe FGR with EFW ~1%ile.  S: Having intermittent deep variable decelerations.   O:  BP (!) 141/71 (BP Location: Right Arm)   Pulse 74   Temp 97.9 F (36.6 C) (Oral)   Resp 18   Ht 5\' 3"  (1.6 m)   Wt 90 kg   LMP  (LMP Unknown)   SpO2 99%   BMI 35.14 kg/m   EFM: 120 bpm / moderate variability / no accels / variable decels  CVE: Dilation: 8 Effacement (%): 70 Station: -1 Presentation: Vertex Exam by:: Dr [email or web address]   A&P: 26 y.o. G1P0000 [redacted]w[redacted]d with IOL for severe pre-eclampsia, FGR 1%ile, abruption. #Labor: Active, s/p AROM. Currently off pitocin.  IUPC placed, and amnioinfusion started. 300cc bolus, followed by 125cc /hr. NICU has consulted with patient  #Pain: Epidural in place #FWB: Cat 2 due to deep variable decels. #GBS unknown - s/p ancef X 2 doses  Pre-eclampsia with SF -  recent labs show low platelets. She is asymptomatic. Blood pressures well controlled at this time. Mg sulf stopped, per Dr [redacted]w[redacted]d. Keppra ordered for post partum. Continue close monitoring.   Despina Hidden MD MPH OB Fellow, Faculty Practice [facility], Center for [facility] 09/18/2022

## 2022-09-19 ENCOUNTER — Other Ambulatory Visit (HOSPITAL_COMMUNITY): Payer: Self-pay

## 2022-09-19 LAB — DIC (DISSEMINATED INTRAVASCULAR COAGULATION)PANEL
D-Dimer, Quant: 10.24 ug/mL-FEU — ABNORMAL HIGH (ref 0.00–0.50)
Fibrinogen: 340 mg/dL (ref 210–475)
INR: 1.1 (ref 0.8–1.2)
Platelets: 23 10*3/uL — CL (ref 150–400)
Prothrombin Time: 14.5 seconds (ref 11.4–15.2)
aPTT: 29 seconds (ref 24–36)

## 2022-09-19 LAB — CBC
HCT: 23.4 % — ABNORMAL LOW (ref 36.0–46.0)
HCT: 23.6 % — ABNORMAL LOW (ref 36.0–46.0)
Hemoglobin: 7.9 g/dL — ABNORMAL LOW (ref 12.0–15.0)
Hemoglobin: 8 g/dL — ABNORMAL LOW (ref 12.0–15.0)
MCH: 31.1 pg (ref 26.0–34.0)
MCH: 30.3 pg (ref 26.0–34.0)
MCHC: 33.8 g/dL (ref 30.0–36.0)
MCHC: 33.9 g/dL (ref 30.0–36.0)
MCV: 92.1 fL (ref 80.0–100.0)
MCV: 89.4 fL (ref 80.0–100.0)
Platelets: 27 10*3/uL — CL (ref 150–400)
Platelets: 24 10*3/uL — CL (ref 150–400)
RBC: 2.54 MIL/uL — ABNORMAL LOW (ref 3.87–5.11)
RBC: 2.64 MIL/uL — ABNORMAL LOW (ref 3.87–5.11)
RDW: 15.7 % — ABNORMAL HIGH (ref 11.5–15.5)
RDW: 16.3 % — ABNORMAL HIGH (ref 11.5–15.5)
WBC: 10.5 10*3/uL (ref 4.0–10.5)
WBC: 13.1 10*3/uL — ABNORMAL HIGH (ref 4.0–10.5)
nRBC: 8.9 % — ABNORMAL HIGH (ref 0.0–0.2)
nRBC: 9.8 % — ABNORMAL HIGH (ref 0.0–0.2)

## 2022-09-19 LAB — SURGICAL PATHOLOGY

## 2022-09-19 LAB — PATHOLOGIST SMEAR REVIEW

## 2022-09-19 MED ORDER — OXYCODONE HCL 5 MG PO TABS
5.0000 mg | ORAL_TABLET | ORAL | 0 refills | Status: DC | PRN
  Filled 2022-09-19: qty 10, 2d supply, fill #0

## 2022-09-19 MED ORDER — FUROSEMIDE 20 MG PO TABS
20.0000 mg | ORAL_TABLET | Freq: Every day | ORAL | 0 refills | Status: DC
  Filled 2022-09-19: qty 3, 3d supply, fill #0

## 2022-09-19 MED ORDER — ACETAMINOPHEN 325 MG PO TABS
650.0000 mg | ORAL_TABLET | ORAL | 0 refills | Status: DC | PRN
  Filled 2022-09-19: qty 60, 5d supply, fill #0

## 2022-09-19 MED ORDER — NIFEDIPINE ER 60 MG PO TB24
60.0000 mg | ORAL_TABLET | Freq: Every day | ORAL | 0 refills | Status: DC
  Filled 2022-09-19: qty 60, 60d supply, fill #0

## 2022-09-19 NOTE — Lactation Note (Signed)
This note was copied from a baby's chart.  NICU Lactation Consultation Note  Patient Name: Misty Wood PETKK'O Date: 09/19/2022 Age:25 hours   Subjective Reason for consult: Initial assessment; Primapara; 1st time breastfeeding; NICU baby; Preterm <34wks; Infant < 6lbs  Lactation conducted in depth initial appointment. I provided basic education, helped with pumping and hand expression, and reviewed milk storage guidelines. [name] plans to take her 5 mls EBM to NICU this morning for oral care.  Lactation to follow up tomorrow.  [name] has taken a breastfeeding class and plans to breastfeed.  Objective  Maternal data: G1P0101  Vaginal, Spontaneous Significant Breast History:: positive breast changes in pregnancy  Current breast feeding challenges:: NICU; preterm  Does the patient have breastfeeding experience prior to this delivery?: No  Pumping frequency: recommend q3 hours; last pump was at 1700 yesterday Pumped volume: 5 mL Flange Size: 24 (may need a 21)   Pump: Advised to call insurance company (has ordered one but will not arrive until 12/30. advised to call insurance. Obtaining paperwork for a [employer] pump as a back-up option.)  Assessment Infant: Feeding Status: NPO  Maternal: Milk volume: Normal  Intervention/Plan Interventions: Breast feeding basics reviewed; Hand express; DEBP; Education  Tools: Flanges; Pump Pump Education: Setup, frequency, and cleaning  Plan: Consult Status: NICU follow-up  NICU Follow-up type: New admission follow up    Walker Shadow 09/19/2022, 10:49 AM

## 2022-09-19 NOTE — Progress Notes (Signed)
Latest Reference Range & Units 09/19/22 08:21  Platelets 150 - 400 K/uL 150 - 400 K/uL 24 (LL) PENDING  (LL): Data is critically low  Result called to Dr. Para March. No new orders given. Carmelina Dane, RN

## 2022-09-19 NOTE — Progress Notes (Signed)
Post Partum Day 1 s/p SVD in setting of abruption, preeclampsia with severe features (BP) Subjective: no complaints, tolerating PO, and minimal bleeding.   Objective: Blood pressure 137/78, pulse 95, temperature 98.5 F (36.9 C), temperature source Oral, resp. rate 18, height 5\' 3"  (1.6 m), weight 90 kg, SpO2 100 %, unknown if currently breastfeeding.  Physical Exam:  General: alert, cooperative, and no distress Lochia: appropriate Uterine Fundus: firm Incision: n/a Foley: Urine is now clear DVT Evaluation: No cords or calf tenderness.  Recent Labs    09/18/22 1756 09/19/22 0821  HGB 8.1* 8.0*  HCT 24.1* 23.6*     Assessment/Plan: Preeclampsia with severe features - Keppra completed and discontinued  - Bps normotensive since delivery and she is on Nifed 60 - Lasix 20 daily started  Thrombocytopenia - Platelets appear to be stabilized. Reviewed limits of platelet transfusions. Would only do if bleeding and drop lower. Blood in foley resolved which is reassuring. No indication for transfusion at this time, but pt consents to it should it be required.  - Likely consumptive from borderline DIC. Fibrinogen is 279.  - Epidural still in place.  - Recheck CBC tomorrow  Anemia - HgB stable in the 8's at this time. Consider IV iron tomorrow   Postpartum - Contraception: Would like to decide at [facility] visit. Not sexually active, no relationship at this time.  - MOF: Breast - Rh status: Positive - Rubella status: Immune - Dispo: Pending improvement in platelets.  - Consults: Lactation, NICU  Neonatal - Doing well in NICU   LOS: 3 days   [facility] 09/19/2022, 3:19 PM

## 2022-09-19 NOTE — TOC Transition Note (Signed)
Discharge medications (4) are being stored in the main pharmacy on the ground floor until patient is ready for discharge.   

## 2022-09-20 LAB — CBC
HCT: 23.6 % — ABNORMAL LOW (ref 36.0–46.0)
Hemoglobin: 7.8 g/dL — ABNORMAL LOW (ref 12.0–15.0)
MCH: 29.8 pg (ref 26.0–34.0)
MCHC: 33.1 g/dL (ref 30.0–36.0)
MCV: 90.1 fL (ref 80.0–100.0)
Platelets: 31 K/uL — ABNORMAL LOW (ref 150–400)
RBC: 2.62 MIL/uL — ABNORMAL LOW (ref 3.87–5.11)
RDW: 17.2 % — ABNORMAL HIGH (ref 11.5–15.5)
WBC: 16 K/uL — ABNORMAL HIGH (ref 4.0–10.5)
nRBC: 11.8 % — ABNORMAL HIGH (ref 0.0–0.2)

## 2022-09-20 MED ORDER — SODIUM CHLORIDE 0.9 % IV SOLN
500.0000 mg | Freq: Once | INTRAVENOUS | Status: AC
  Administered 2022-09-20: 500 mg via INTRAVENOUS
  Filled 2022-09-20: qty 500

## 2022-09-20 NOTE — Progress Notes (Signed)
Post Partum Day  Subjective: Patient reports doing well with some back pain at epidural site. She is ambulating, voiding and tolerating a regular diet   Objective: Blood pressure 127/78, pulse 67, temperature 98.3 F (36.8 C), temperature source Oral, resp. rate 18, height 5\' 3"  (1.6 m), weight 90 kg, SpO2 100 %, unknown if currently breastfeeding.  Physical Exam:  General: alert, cooperative, and no distress Lochia: appropriate Uterine Fundus: firm Incision: n/a DVT Evaluation: No evidence of DVT seen on physical exam.     Latest Ref Rng & Units 09/20/2022    4:15 AM 09/19/2022    8:21 AM 09/18/2022    5:56 PM  CBC  WBC 4.0 - 10.5 K/uL 16.0  13.1  10.6   Hemoglobin 12.0 - 15.0 g/dL 7.8  8.0  8.1   Hematocrit 36.0 - 46.0 % 23.6  23.6  24.1   Platelets 150 - 400 K/uL 31  24    23   32      Assessment/Plan: Patient is PPD#2 s/p SVD with preeclampsia with severe features - BP stable on procardia and lasix - Continue monitoring - Patient with persistent anemia without symptoms- Patient agrees to Venofer - Patient with persistent thrombocytopenia, stable. Epidural cath still in place - Newborn stable in NICU per patient   LOS: 4 days   09/20/2022, MD 09/20/2022, 8:02 AM

## 2022-09-21 LAB — CBC
HCT: 23.5 % — ABNORMAL LOW (ref 36.0–46.0)
Hemoglobin: 7.7 g/dL — ABNORMAL LOW (ref 12.0–15.0)
MCH: 30.3 pg (ref 26.0–34.0)
MCHC: 32.8 g/dL (ref 30.0–36.0)
MCV: 92.5 fL (ref 80.0–100.0)
Platelets: 48 10*3/uL — ABNORMAL LOW (ref 150–400)
RBC: 2.54 MIL/uL — ABNORMAL LOW (ref 3.87–5.11)
RDW: 17.4 % — ABNORMAL HIGH (ref 11.5–15.5)
WBC: 16.4 10*3/uL — ABNORMAL HIGH (ref 4.0–10.5)
nRBC: 10.1 % — ABNORMAL HIGH (ref 0.0–0.2)

## 2022-09-21 NOTE — Progress Notes (Signed)
POSTPARTUM PROGRESS NOTE  PPD#3  Subjective:  Misty Wood is a 26 y.o. G1P0101 s/p NSVD at [redacted]w[redacted]d. Today she notes some night sweats, but overall doing well. She denies any problems with ambulating, voiding or po intake. Denies nausea or vomiting. She has passed flatus, +BM.  Pain is well controlled.  Lochia minimal Denies fever/chills/chest pain/SOB.  no HA, no blurry vision, no RUQ pain  Objective: Blood pressure (!) 109/52, pulse 99, temperature 97.9 F (36.6 C), temperature source Oral, resp. rate 16, height 5\' 3"  (1.6 m), weight 90 kg, SpO2 98 %, unknown if currently breastfeeding.  Physical Exam:  General: alert, cooperative and no distress Chest: no respiratory distress Heart: regular rate and rhythm Abdomen: soft, nontender Uterine Fundus: firm, appropriately tender DVT Evaluation: No calf swelling or tenderness Extremities: no edema Skin: warm, dry  Results for orders placed or performed during the hospital encounter of 09/16/22 (from the past 24 hour(s))  CBC     Status: Abnormal   Collection Time: 09/21/22  5:14 AM  Result Value Ref Range   WBC 16.4 (H) 4.0 - 10.5 K/uL   RBC 2.54 (L) 3.87 - 5.11 MIL/uL   Hemoglobin 7.7 (L) 12.0 - 15.0 g/dL   HCT 09/23/22 (L) 77.4 - 12.8 %   MCV 92.5 80.0 - 100.0 fL   MCH 30.3 26.0 - 34.0 pg   MCHC 32.8 30.0 - 36.0 g/dL   RDW 78.6 (H) 76.7 - 20.9 %   Platelets 48 (L) 150 - 400 K/uL   nRBC 10.1 (H) 0.0 - 0.2 %    Assessment/Plan: Misty Wood is a 26 y.o. G1P0101 s/p NSVD at [redacted]w[redacted]d PPD#3 complicated by: 1) Preeclampsia with severe features -BP well controlled with Procardia and Lasix  2) Thrombocytopenia -Plt improved, but still not high enough to remove epidural cath  3) Anemia -s/p venofer -Hgb stable, currently asymptomatic  Continue with routine postpartum care Baby in NICU  Dispo: Continue postpartum care as outlined above   LOS: 5 days   [redacted]w[redacted]d, DO Faculty Attending, Center for [facility] 09/21/2022, 7:21 AM

## 2022-09-22 LAB — COMPREHENSIVE METABOLIC PANEL
ALT: 427 U/L — ABNORMAL HIGH (ref 0–44)
AST: 194 U/L — ABNORMAL HIGH (ref 15–41)
Albumin: 2.9 g/dL — ABNORMAL LOW (ref 3.5–5.0)
Alkaline Phosphatase: 105 U/L (ref 38–126)
Anion gap: 10 (ref 5–15)
BUN: 32 mg/dL — ABNORMAL HIGH (ref 6–20)
CO2: 23 mmol/L (ref 22–32)
Calcium: 9.1 mg/dL (ref 8.9–10.3)
Chloride: 100 mmol/L (ref 98–111)
Creatinine, Ser: 1.31 mg/dL — ABNORMAL HIGH (ref 0.44–1.00)
GFR, Estimated: 58 mL/min — ABNORMAL LOW (ref >60)
Glucose, Bld: 91 mg/dL (ref 70–99)
Potassium: 4.7 mmol/L (ref 3.5–5.1)
Sodium: 133 mmol/L — ABNORMAL LOW (ref 135–145)
Total Bilirubin: 0.4 mg/dL (ref 0.3–1.2)
Total Protein: 6.2 g/dL — ABNORMAL LOW (ref 6.5–8.1)

## 2022-09-22 LAB — CBC
HCT: 24.4 % — ABNORMAL LOW (ref 36.0–46.0)
Hemoglobin: 7.8 g/dL — ABNORMAL LOW (ref 12.0–15.0)
MCH: 30.7 pg (ref 26.0–34.0)
MCHC: 32 g/dL (ref 30.0–36.0)
MCV: 96.1 fL (ref 80.0–100.0)
Platelets: 77 10*3/uL — ABNORMAL LOW (ref 150–400)
RBC: 2.54 MIL/uL — ABNORMAL LOW (ref 3.87–5.11)
RDW: 16.7 % — ABNORMAL HIGH (ref 11.5–15.5)
WBC: 16.8 10*3/uL — ABNORMAL HIGH (ref 4.0–10.5)
nRBC: 6.9 % — ABNORMAL HIGH (ref 0.0–0.2)

## 2022-09-22 MED ORDER — FUROSEMIDE 20 MG PO TABS: 20.0000 mg | ORAL_TABLET | Freq: Every day | ORAL | 0 refills | Status: DC

## 2022-09-22 MED ORDER — DOCUSATE SODIUM 100 MG PO CAPS: 100.0000 mg | ORAL_CAPSULE | Freq: Two times a day (BID) | ORAL | 0 refills | Status: AC

## 2022-09-22 MED ORDER — OXYCODONE HCL 5 MG PO TABS: 5.0000 mg | ORAL_TABLET | Freq: Four times a day (QID) | ORAL | 0 refills | Status: AC | PRN

## 2022-09-22 NOTE — Progress Notes (Signed)
Epidural out, tip intact.  Pt instructed to let RN know if she becomes numb in her lower body. Cephus Shelling

## 2022-09-22 NOTE — Discharge Summary (Incomplete)
Postpartum Discharge Summary  Date of Service updated-12/18     Patient Name: Misty Wood DOB: [birth year] MRN: [id number]  Date of admission: 09/16/2022 Delivery date:09/18/2022  Delivering provider: Stormy Card  Date of discharge: 09/23/2022  Admitting diagnosis: Preeclampsia, third trimester [O14.93] Gestational hypertension [O13.9] Intrauterine pregnancy: [redacted]w[redacted]d    Secondary diagnosis:  Principal Problem:   Hypertension in pregnancy, preeclampsia, severe, delivered Active Problems:   Supervision of other normal pregnancy, antepartum   IUGR (intrauterine growth restriction) affecting care of mother   Preterm delivery   Vaginal delivery   Placental abruption  Additional problems: Thrombocytopenia    Discharge diagnosis: Preterm Pregnancy Delivered                                              Post partum procedures: Magnesium sulfate for eclampsia prophylaxis Augmentation: AROM and Pitocin Complications: Placental Abruption  Hospital course: Induction of Labor With Vaginal Delivery   26y.o. yo G1P0000 at 347w4das admitted to the hospital 09/16/2022 for induction of labor.  Indication for induction: pre-eclampsia with severe features, placenta abruption.  Patient had an labor course complicated by thrombocytopenia. Membrane Rupture Time/Date: 8:58 PM ,09/17/2022   Delivery Method:Vaginal, Spontaneous  Episiotomy: None  Lacerations:  None  Details of delivery can be found in separate delivery note.  Patient received intrapartum and postpartum magnesium sulfate for eclampsia prophylaxis as per protocol. BP control was obtained with Procardia XL 60 mg qd and she was started on Lasix 20 mg po qd x 5 days, there were no further immediate complications.  Otherwise, patient had a routine postpartum course. She is ambulating, tolerating a regular diet, passing flatus, and urinating well. Patient is discharged home in stable condition on 09/23/2022, and will follow up  in the office for BP check in one week. Patient had a postpartum course complicated by***. Patient is discharged home 09/23/22.  Newborn Data: Birth date:09/18/2022  Birth time:4:10 AM  Gender:Female  Living status:Living  Apgars:2 ,6  Weight:   Magnesium Sulfate received: Yes: Seizure prophylaxis BMZ received: Yes Rhophylac:N/A MMR:{MMR:30440033} T-DaP:{Tdap:23962} Flu: {F{WUJ:81191}ransfusion:{Transfusion received:30440034}  Physical exam  Vitals:   09/22/22 0358 09/22/22 0806 09/22/22 1147 09/22/22 1822  BP: (!) 115/56 116/67 126/70 119/72  Pulse: 89 (!) 104 100 (!) 110  Resp: _0 Temp: 98.4 F (36.9 C) 98.4 F (36.9 C) 98.1 F (36.7 C) 98.5 F (36.9 C)  TempSrc: Oral Oral Oral Oral  SpO2: 100% 100% 100% 99%  Weight:      Height:       General: {Exam; general:21111117} Lochia: {Desc; appropriate/inappropriate:30686::"appropriate"} Uterine Fundus: {Desc; firm/soft:30687} Incision: {Exam; incision:21111123} DVT Evaluation: {Exam; dvYNW:2956213}abs: Lab Results  Component Value Date   WBC 16.8 (H) 09/22/2022   HGB 7.8 (L) 09/22/2022   HCT 24.4 (L) 09/22/2022   MCV 96.1 09/22/2022   PLT 77 (L) 09/22/2022      Latest Ref Rng & Units 09/21/2022   11:41 PM  CMP  Glucose 70 - 99 mg/dL 91   BUN 6 - 20 mg/dL 32   Creatinine 0.44 - 1.00 mg/dL 1.31   Sodium 135 - 145 mmol/L 133   Potassium 3.5 - 5.1 mmol/L 4.7   Chloride 98 - 111 mmol/L 100   CO2 22 - 32 mmol/L 23   Calcium 8.9 - 10.3 mg/dL 9.1  Total Protein 6.5 - 8.1 g/dL 6.2   Total Bilirubin 0.3 - 1.2 mg/dL 0.4   Alkaline Phos 38 - 126 U/L 105   AST 15 - 41 U/L 194   ALT 0 - 44 U/L 427    Edinburgh Score:    09/19/2022   11:34 AM  Edinburgh Postnatal Depression Scale Screening Tool  I have been able to laugh and see the funny side of things. 0  I have looked forward with enjoyment to things. 0  I have blamed myself unnecessarily when things went wrong. 1  I have been anxious or worried for  no good reason. 0  I have felt scared or panicky for no good reason. 0  Things have been getting on top of me. 1  I have been so unhappy that I have had difficulty sleeping. 1  I have felt sad or miserable. 0  I have been so unhappy that I have been crying. 0  The thought of harming myself has occurred to me. 0  Edinburgh Postnatal Depression Scale Total 3     After visit meds:  Allergies as of 09/22/2022       Reactions   Penicillins Hives, Other (See Comments)   Has patient had a PCN reaction causing immediate rash, facial/tongue/throat swelling, SOB or lightheadedness with hypotension: Yes Has patient had a PCN reaction causing severe rash involving mucus membranes or skin necrosis: No Has patient had a PCN reaction that required hospitalization No Has patient had a PCN reaction occurring within the last 10 years: No If all of the above answers are "NO", then may proceed with Cephalosporin use.        Medication List     STOP taking these medications    Blood Pressure Kit Devi       TAKE these medications    acetaminophen 325 MG tablet Commonly known as: Tylenol Take 2 tablets (650 mg total) by mouth every 4 (four) hours as needed (for pain scale < 4).   albuterol 108 (90 Base) MCG/ACT inhaler Commonly known as: VENTOLIN HFA Inhale 2 puffs into the lungs every 6 (six) hours as needed for wheezing or shortness of breath.   docusate sodium 100 MG capsule Commonly known as: Colace Take 1 capsule (100 mg total) by mouth 2 (two) times daily.   furosemide 20 MG tablet Commonly known as: LASIX Take 1 tablet (20 mg total) by mouth daily for 2 days.   NIFEdipine 60 MG 24 hr tablet Commonly known as: ADALAT CC Take 1 tablet (60 mg total) by mouth daily.   oxyCODONE 5 MG immediate release tablet Commonly known as: Oxy IR/ROXICODONE Take 1 tablet (5 mg total) by mouth every 6 (six) hours as needed for up to 3 days (pain scale 4-7).   prenatal multivitamin Tabs  tablet Take 1 tablet by mouth daily at 12 noon.         Discharge home in stable condition Infant Feeding: {Baby feeding:23562} Infant Disposition:{CHL IP OB HOME WITH WIOXBD:53299} Discharge instruction: per After Visit Summary and Postpartum booklet. Activity: Advance as tolerated. Pelvic rest for 6 weeks.  Diet: {OB MEQA:83419622} Future Appointments: Future Appointments  Date Time Provider [place]  09/25/2022 10:40 AM [place] None  11/07/2022  9:15 AM Constant, Vickii Chafe, MD [place] None   Follow up Visit:  [place]. Go to.   Specialty: Obstetrics and Gynecology Why: Please follow up in 1 wk  for a BP check Contact information: [address] [place] [phone]                The following message was sent to [employer] by Mikki Santee, MD  Please schedule this patient for a In person postpartum visit in 4 weeks with the following provider: MD. Additional Postpartum F/U:BP check 1 week  High risk pregnancy complicated by: pre-eclampsia, severe FGR Delivery mode:  Vaginal, Spontaneous  Anticipated Birth Control:  Unsure   09/23/2022 Verita Schneiders, MD

## 2022-09-22 NOTE — Clinical Social Work Maternal (Signed)
CLINICAL SOCIAL WORK MATERNAL/CHILD NOTE  Patient Details  Name: MASIYA CLAASSEN MRN: [id number] Date of Birth: 1996-04-30  Date:  09/22/2022  Clinical Social Worker Initiating Note:  Abundio Miu, [place] Date/Time: Initiated:  09/22/22/1218     Child's Name:  Neysa Bonito   Biological Parents:  Mother, Father (Father: Gardner Candle)   Need for Interpreter:  None   Reason for Referral:  Parental Support of Premature Babies < 32 weeks/or Critically Ill babies   Address:  [place] [address]    Phone number:  [phone] (home)     Additional phone number:   Household Members/Support Persons (HM/SP):   Household Member/Support Person 1, Household Member/Support Person 2   HM/SP Name Relationship DOB or Age  HM/SP -[place] mom    HM/SP -2   sister    HM/SP -3        HM/SP -4        HM/SP -5        HM/SP -6        HM/SP -7        HM/SP -8          Natural Supports (not living in the home):      Professional Supports: None   Employment: Unemployed   Type of Work:     Education:  [place] arranged:    Museum/gallery curator Resources:  [employer]   Other Resources:  Physicist, medical  , [place] Considerations Which May Impact Care:    Strengths:  Understanding of illness, Home prepared for child  , Ability to meet basic needs     Psychotropic Medications:         Pediatrician:       Pediatrician List:   [place]      Pediatrician Fax Number:    Risk Factors/Current Problems:  None   Cognitive State:  Alert  , Able to Concentrate  , Goal Oriented  , Linear Thinking     Mood/Affect:  Calm  , Interested  , Comfortable  , Happy     CSW Assessment: CSW met with MOB at bedside to complete psychosocial assessment. CSW introduced self and explained role. MOB was welcoming, pleasant, and remained engaged during  assessment. MOB reported that she resides with her mom and sister. MOB reported that she has items needed to care for infant including a car seat and crib. MOB shared that they are also still buying items for infant. MOB denied needing assistance getting items for infant. CSW inquired about MOB's support system, MOB reported that her mom and sister are supports.   CSW inquired about MOB's mental health history. MOB reported that she was diagnosed with anxiety and depression in 2019 which was situational. MOB denied any current symptoms. MOB reported that she is not taking any medication nor participating in therapy to treat mental health diagnoses. MOB denied needing any therapy resources. CSW inquired about how MOB was feeling emotionally since giving birth, MOB reported that she was feeling great. MOB presented calm and did not demonstrate any acute mental health signs/symptoms. CSW assessed for safety, MOB denied SI,HI, and domestic violence.   CSW provided education regarding the baby blues period vs. perinatal mood disorders, discussed treatment and gave resources for mental health follow up if concerns arise.  CSW  recommends self-evaluation during the postpartum time period using the New Mom Checklist from Postpartum Progress and encouraged MOB to contact a medical professional if symptoms are noted at any time.    CSW provided review of Sudden Infant Death Syndrome (SIDS) precautions.    CSW and MOB discussed infant's NICU admission. CSW informed MOB about the NICU, what to expect, and resources/supports available while infant is admitted to the NICU. MOB reported that she feels comfortable with infant's NICU admission and shared that nurses have been informative. MOB denied any transportation barriers with visiting infant in the NICU. MOB reported that meal vouchers and gas cards would be helpful. CSW agreed to provide and place at infant's bedside. MOB denied any questions/concerns regarding the  NICU.   CSW inquired about noted psychosocial stressor "threatened with loss of utilities". MOB explained that sometimes there is financial strain but reassured CSW that no utilities would be cut off. CSW informed MOB about financial assistance resource, [place] financial assistance program and provided link to apply if needed. MOB agreed to shared information with her mother.   CSW identifies no further need for intervention and no barriers to discharge at this time. MOB opted to call CSW if any needs/concerns arise versus CSW checking in weekly.   CSW placed 3 gas cards and 5 meal vouchers at infant's bedside.   CSW Plan/Description:  Psychosocial Support and Ongoing Assessment of Needs, Sudden Infant Death Syndrome (SIDS) Education, Perinatal Mood and Anxiety Disorder (PMADs) Education, Other Information/Referral to [employer], Other Patient/Family Education    [employer], LCSW 09/22/2022, 12:21 PM

## 2022-09-22 NOTE — Progress Notes (Addendum)
POSTPARTUM PROGRESS NOTE  PPD#4  Subjective:  Misty Wood is a 26 y.o. G1P0101 s/p NSVD at [redacted]w[redacted]d.  Reports some pain overnight, improved with medication. She denies any problems with ambulating, voiding or po intake. Denies nausea or vomiting. She has passed flatus, +BM.  Pain is well controlled.  Lochia minimal Denies fever/chills/chest pain/SOB.  no HA, no blurry vision, no RUQ pain  Objective: Blood pressure (!) 115/56, pulse 89, temperature 98.4 F (36.9 C), temperature source Oral, resp. rate 18, height 5\' 3"  (1.6 m), weight 90 kg, SpO2 100 %, unknown if currently breastfeeding.  Physical Exam:  General: alert, cooperative and no distress Chest: no respiratory distress Heart: regular rate and rhythm Abdomen: soft, nontender Uterine Fundus: firm, non-tender, below umbilicus DVT Evaluation: No calf swelling or tenderness Extremities: no edema Skin: warm, dry  Results for orders placed or performed during the hospital encounter of 09/16/22 (from the past 24 hour(s))  Comprehensive metabolic panel     Status: Abnormal   Collection Time: 09/21/22 11:41 PM  Result Value Ref Range   Sodium 133 (L) 135 - 145 mmol/L   Potassium 4.7 3.5 - 5.1 mmol/L   Chloride 100 98 - 111 mmol/L   CO2 23 22 - 32 mmol/L   Glucose, Bld 91 70 - 99 mg/dL   BUN 32 (H) 6 - 20 mg/dL   Creatinine, Ser 09/23/22 (H) 0.44 - 1.00 mg/dL   Calcium 9.1 8.9 - 1.49 mg/dL   Total Protein 6.2 (L) 6.5 - 8.1 g/dL   Albumin 2.9 (L) 3.5 - 5.0 g/dL   AST 70.2 (H) 15 - 41 U/L   ALT 427 (H) 0 - 44 U/L   Alkaline Phosphatase 105 38 - 126 U/L   Total Bilirubin 0.4 0.3 - 1.2 mg/dL   GFR, Estimated 58 (L) >60 mL/min   Anion gap 10 5 - 15    Assessment/Plan: Misty Wood is a 26 y.o. G1P0101 s/p NSVD at [redacted]w[redacted]d PPD#4 complicated by: 1) Preeclampsia with severe features -BP well controlled with Procardia and Lasix -repeat CMP as above, currently asymptomatic -will plan to follow lab liver and kidney function as  outpatient.  Kidney function stable  2) Thrombocytopenia -Plt now above 70 -reviewed with anesthesia, plan to pull epidural this am  3) Anemia -s/p venofer -Hgb stable, currently asymptomatic  Continue with routine postpartum care Baby in NICU  Dispo: Once pulled will monitor for at least 12hr- possible discharge late tonight or tomorrow.   LOS: 6 days   [redacted]w[redacted]d, DO Faculty Attending, [facility] 09/22/2022, 6:06 AM

## 2022-09-22 NOTE — Plan of Care (Signed)

## 2022-09-22 NOTE — Progress Notes (Signed)
   09/22/22 1838  Departure Condition  Departure Condition Good  Mobility at [facility]  Patient/Caregiver Teaching Teach Back Method Used;Discharge instructions reviewed;Prescriptions reviewed;Follow-up care reviewed;Pain management discussed;Medications discussed;Patient/caregiver verbalized understanding;Educated about hypertension in pregnancy  Departure Mode By self  Was procedural sedation performed on this patient during this visit? No   Patient alert and oriented x4, and VS and pain stable.

## 2022-09-22 NOTE — TOC Transition Note (Signed)
Discharge medications (4) have been retrieved from [facility] weekend lockup and are now being stored in the [facility] on the second floor until patient is ready for discharge.

## 2022-09-22 NOTE — Lactation Note (Signed)
This note was copied from a baby's chart.  NICU Lactation Consultation Note  Patient Name: Misty Wood [id number] Date: 09/22/2022 Age:25 days  Subjective Reason for consult: Follow-up assessment; Primapara; 1st time breastfeeding; NICU baby; Preterm <34wks  Lactation followed up with Misty Wood. Her milk is transitioning. I provided her with a hands-free pumping top and showed her how to use the expression phase on her pump. I recommended pumping q3 hours for 15-20 minutes.  In the event that she does not have her insurance pump secured today (at discharge), I showed her how to use her manual pump. We also discussed milk storage guidelines.  Misty Wood has breast milk storage labels for home use.  Objective Infant data: Mother's Current Feeding Choice: Breast Milk and Donor Milk  Infant feeding assessment Scale for Readiness: 3   Maternal data: G1P0101  Vaginal, Spontaneous Current breast feeding challenges:: NICU  Does the patient have breastfeeding experience prior to this delivery?: No  Pumping frequency: q3 hours Pumped volume: 50 mL Flange Size: 24  Pump: Advised to call insurance company [employer] Pump referral placed; needs to cancel insurance pump; plans to call on 12/18)  Assessment Maternal: Milk volume: Normal  Intervention/Plan Interventions: Breast feeding basics reviewed; Education; DEBP; Hand pump  Tools: Pump; Hands-free pumping top Pump Education: Setup, frequency, and cleaning  Plan: Consult Status: NICU follow-up  NICU Follow-up type: New admission follow up; Verify absence of engorgement; Weekly NICU follow up    Misty Wood 09/22/2022, 10:44 AM

## 2022-09-23 DIAGNOSIS — O9081 Anemia of the puerperium: Secondary | ICD-10-CM | POA: Diagnosis present

## 2022-09-24 ENCOUNTER — Other Ambulatory Visit (INDEPENDENT_AMBULATORY_CARE_PROVIDER_SITE_OTHER): Payer: Self-pay | Admitting: Obstetrics & Gynecology

## 2022-09-25 ENCOUNTER — Ambulatory Visit: Payer: Medicaid Other

## 2022-09-25 ENCOUNTER — Ambulatory Visit (INDEPENDENT_AMBULATORY_CARE_PROVIDER_SITE_OTHER): Payer: Medicaid Other | Admitting: *Deleted

## 2022-09-25 VITALS — BP 123/82 | HR 80

## 2022-09-25 DIAGNOSIS — O1493 Unspecified pre-eclampsia, third trimester: Secondary | ICD-10-CM

## 2022-09-25 NOTE — Progress Notes (Signed)
Subjective:  Misty Wood is a 26 y.o. female here for BP check. HX severe preeclampsia and PTD.   Hypertension ROS: taking medications as instructed, no medication side effects noted, no TIA's, no chest pain on exertion, no dyspnea on exertion, and no swelling of ankles.    Objective:  LMP  (LMP Unknown)   Appearance alert, well appearing, and in no distress. General exam BP noted to be well controlled today in office.    Assessment:   Blood Pressure well controlled. No recurrent preeclampsia symptoms noted.  Plan:  Current treatment plan is effective, no change in therapy.Marland Kitchen

## 2022-10-01 ENCOUNTER — Encounter: Payer: Medicaid Other | Admitting: Student

## 2022-10-01 ENCOUNTER — Encounter: Payer: Medicaid Other | Admitting: Obstetrics and Gynecology

## 2022-10-03 ENCOUNTER — Ambulatory Visit: Payer: Medicaid Other

## 2022-10-04 ENCOUNTER — Ambulatory Visit: Payer: Self-pay

## 2022-10-04 NOTE — Lactation Note (Signed)
This note was copied from a baby's chart.  NICU Lactation Consultation Note  Patient Name: Misty Wood [id number] Date: 10/04/2022 Age:25 wk.o.  Subjective Reason for consult: Follow-up assessment; 1st time breastfeeding; Primapara; NICU baby; Late-preterm 34-36.6wks; Other (Comment); Infant < 6lbs (Cord blood THC (+))  Visited with family of 63 88/31 weeks old (adjusted) NICU female; Misty Wood is a P1 and reports she's pumping but not consistently. Explained the importance of consistent pumping to protect her supply; her plan is to do both, breast and formula feeding. She had a question regarding her flanges, size 21 seems appropriate at this time, but she might benefit from size 18 once they become available in late January. She got her Stork Spectra pump prior her discharge and voiced that the flanges are too big, advised to get smaller flanges off the shelf or online for optimal milk removal and maximum comfort. Reviewed pumping schedule, lactogenesis II, strategies to increase supply, pre-feeding activities and benefits of STS care.  Objective Infant data: Mother's Current Feeding Choice: Breast Milk and Donor Milk  Infant feeding assessment Scale for Readiness: 2  Maternal data: G1P0101  Vaginal, Spontaneous Pumping frequency: 3 times/24 hours Pumped volume: 60 mL Flange Size: 21 (she might need # 18, will wait till [employer] sends the first shipment) Pump: Stork Pump  Assessment Infant: In NICU  Maternal: Milk volume: Low  Intervention/Plan Interventions: Breast feeding basics reviewed; DEBP; Education Tools: Pump; Flanges Pump Education: Setup, frequency, and cleaning; Milk Storage  Plan of care: Encouraged pumping every 3 hours, ideally 8 pumping sessions/24 hours She'll start power pumping in the AM She'll call for latch assistance once baby is ready  No other support person at this time. All questions and concerns answered, family to contact [facility] services  PRN.  Consult Status: NICU follow-up  NICU Follow-up type: Weekly NICU follow up; Assist with IDF-1 (Mother to pre-pump before breastfeeding)   Misty Wood Misty Wood 10/04/2022, 4:15 PM

## 2022-10-11 ENCOUNTER — Ambulatory Visit: Payer: Self-pay

## 2022-10-11 NOTE — Lactation Note (Signed)
This note was copied from a baby's chart.  NICU Lactation Consultation Note  Patient Name: Boy Raevin Wierenga PYPPJ'K Date: 10/11/2022 Age:26 wk.o.  Subjective Reason for consult: Follow-up assessment; Primapara; 1st time breastfeeding; NICU baby; Late-preterm 34-36.6wks; Other (Comment); Infant < 6lbs (Cord blood THC (+))  Visited with family of 18 63/1 weeks old (adjusted) NICU female; Ms. Rigdon is a P1 and reports that she has started pumping more often and also power pumping; she has seen an increase in her supply on a 24 hours period; praised her for her efforts. She voiced the # 21 flanges are working out better for her than the # 24; she has also ordered # 18 flanges for her Spectra pump; they should be coming in the mail next week. Reviewed strategies to continue increasing her supply and the potential use of galactagogues.   Objective Infant data: Mother's Current Feeding Choice: Breast Milk  Infant feeding assessment Scale for Readiness: 1  Maternal data: G1P0101  Vaginal, Spontaneous Pumping frequency: 6-7 times/24 hours Pumped volume: 30 mL (30-45 ml) Flange Size: 21 (She ordered # 18 flanges for her Spectra pump at home) Pump: Stork Pump [employer])  Assessment Infant: In NICU  Maternal: Milk volume: Low  Intervention/Plan Interventions: Breast feeding basics reviewed; DEBP; Education  Tools: Pump; Flanges Pump Education: Setup, frequency, and cleaning; Milk Storage  Plan of care: Encouraged pumping every 3 hours, ideally 8 pumping sessions/24 hours She'll continue power pumping in the AM She'll call for latch assistance once baby is ready   No other support person at this time. All questions and concerns answered, family to contact [facility] services PRN.  Consult Status: NICU follow-up  NICU Follow-up type: Weekly NICU follow up; Assist with IDF-1 (Mother to pre-pump before breastfeeding)   Daquawn Seelman Francene Boyers 10/11/2022, 12:04 PM

## 2022-10-16 ENCOUNTER — Ambulatory Visit: Payer: Self-pay

## 2022-10-16 NOTE — Lactation Note (Addendum)
This note was copied from a baby's chart.  NICU Lactation Consultation Note  Patient Name: Boy Ainsley Deakins ASNKN'L Date: 10/16/2022 Age:26 wk.o.   Subjective Reason for consult: Follow-up assessment; Mother's request; Primapara; 1st time breastfeeding; NICU baby; Preterm <34wks; Late-preterm 34-36.6wks; Infant < 6lbs; Breastfeeding assistance  Lactation followed up with Seth Bake to assist with breastfeeding her son, [name]. He appeared quiet alert. This LC with another [place] Chrys Racer) provided education on infant feeding cues and LPI feeding behavior. I assisted with placement of [name] on the left breast in cross cradle hold. I assisted with latching infant by "sandwiching" her breast tissue. I demonstrated how to stimulate gape reflex with the breast, and infant responded appropriately. He latched, and he was able to grasp and hold the breast in his mouth with only minor redirections initially. He breastfed for approximately 15 minutes in total, but I noted NS and NNS (NS was up to 10 minutes in duration). I noted rhythmic suckling sequences; infant appeared relaxed.  I recommended that Aliesha reinforce her breastfeeding skills with [name] over the next 24 hours with lactation to follow up on 1/12 at 1200. At this time, we will review latch skills and discuss readiness for IDF. We will troubleshoot some challenges she is having with her breast pump and set up a plan to help Alexiz increase her milk volume.  Objective Infant data: Mother's Current Feeding Choice: Breast Milk  Infant feeding assessment Scale for Readiness: 2 Scale for Quality: 3  Maternal data: G1P0101  Vaginal, Spontaneous Current breast feeding challenges:: NICU  Does the patient have breastfeeding experience prior to this delivery?: No  Pumping frequency: recommend increasing frequency to q3 hours; pumping inconsistently at times Pumped volume: 30 mL (sometimes several ounces)   Pump: Personal, Stork Pump  [employer])  Assessment Infant: LATCH Score: 8   Intervention/Plan Interventions: Breast feeding basics reviewed; Assisted with latch; Skin to skin; Hand express; Adjust position; Support pillows; Position options; Education  Pump Education: Setup, frequency, and cleaning  Plan: Consult Status: NICU follow-up  NICU Follow-up type: Weekly NICU follow up    Lenore Manner 10/16/2022, 4:16 PM

## 2022-10-17 ENCOUNTER — Ambulatory Visit: Payer: Self-pay

## 2022-10-17 NOTE — Lactation Note (Signed)
This note was copied from a baby's chart.  NICU Lactation Consultation Note  Patient Name: Misty Wood GGYIR'S Date: 10/17/2022 Age:26 wk.o.   Subjective Reason for consult: Follow-up assessment; Mother's request; 1st time breastfeeding; NICU baby; Late-preterm 34-36.6wks; Preterm <34wks; Infant < 6lbs; Breastfeeding assistance  Lactation followed up with Misty Wood and her son, Misty Wood. She pumped 50 mls at 1030. I inspected her Stork Pump, and we noted a tear in the white "duck bill" membrane. This appeared to be affecting suction. I advised that she replace the membrane, and we discussed where to obtain replacement parts.  Parent tries to pump 8 times a day, including at night, but sometimes pumps 6 times a day (realistically).  I assisted with latching Misty Wood to the right breast. He latches with light assistance (hand over hand). When he became fatigued, we moved him onto Misty Wood's chest, and he began to root again. We then moved him to her right breast, and Misty Wood was able to latch him without my assistance.  I educated on how to differentiate NS from NNS and advised that she begin to time her feedings (with NS). We noted audible swallows with 2:1 suck:swallow ratio during his most active feeding period.  Objective Infant data: Mother's Current Feeding Choice: Breast Milk  Infant feeding assessment Scale for Readiness: 2 Scale for Quality: 3   Maternal data: G1P0101  Vaginal, Spontaneous Current breast feeding challenges:: NICU  Does the patient have breastfeeding experience prior to this delivery?: No  Pumping frequency: 6-8 times/day Pumped volume: 50 mL   Pump: Personal (Spectra)  Assessment Infant: LATCH Score: 9  Maternal: No data recorded  Intervention/Plan Interventions: Breast feeding basics reviewed; Assisted with latch; Hand express; Education; Support pillows; Adjust position; DEBP  Tools: Pump Pump Education: Setup, frequency, and  cleaning  Plan: Consult Status: NICU follow-up  NICU Follow-up type: Weekly NICU follow up    Misty Wood 10/17/2022, 12:08 PM

## 2022-10-27 ENCOUNTER — Ambulatory Visit: Payer: Self-pay

## 2022-10-27 NOTE — Lactation Note (Signed)
This note was copied from a baby's chart.  NICU Lactation Consultation Note  Patient Name: Boy Felicie Kocher [id number]'[id number] Date: 10/27/2022 Age:26 wk.o.  Subjective Reason for consult: Follow-up assessment; Primapara; 1st time breastfeeding; Early term 48-38.6wks; NICU baby; Infant < 6lbs; Other (Comment) (Cord blood THC (+))  Visited with family of 30 26/51 weeks old (adjusted) NICU female, Ms. Bettes is  P1 and reports feeling discouraged because her supply has not increased yet, she's been under a lot of stress and since baby started getting bloody stools he was put back on Elecare this morning. Ms. Aristizabal voiced she's tried everything to try getting her supply back, the only thing she's not doing consistently is the 8 pumping sessions/24 hours. Explained the importance of nipple stimulation to increase supply, revised those strategies again. Let her know that if breastfeeding becomes too stressful or if she decides it is not for her, we'll continue to support her feeding choice whatever it is, as long as baby is getting fed. Asked her to call for assistance when needed.  Objective Infant data: Mother's Current Feeding Choice: Formula (Elecare 24 calorie formula)  Infant feeding assessment Scale for Readiness: 1 Scale for Quality: 2  Maternal data: G1P0101  Vaginal, Spontaneous Pumping frequency: 3-4 times Pumped volume: 15 mL (15-20 ml.) Flange Size: 21 Pump: Personal (Spectra)  Assessment Infant: Feeding Status: Ad lib  Maternal: Milk volume: Low  Intervention/Plan Interventions: Breast feeding basics reviewed; DEBP; Education Tools: Pump; Flanges Pump Education: Setup, frequency, and cleaning; Milk Storage  Plan of care: Encouraged pumping every 3 hours, ideally 8 pumping sessions/24 hours or the closest she can get to it She'll continue power pumping in the AM We'll defer breastfeeding at this time since baby is on Elecare 24 calorie formula due to bloody stools   No  other support person at this time. All questions and concerns answered, family to contact [facility] services PRN.  Consult Status: NICU follow-up  NICU Follow-up type: Weekly NICU follow up   [place] 10/27/2022, 2:45 PM

## 2022-11-07 ENCOUNTER — Ambulatory Visit (INDEPENDENT_AMBULATORY_CARE_PROVIDER_SITE_OTHER): Payer: Medicaid Other | Admitting: Obstetrics and Gynecology

## 2022-11-07 ENCOUNTER — Encounter: Payer: Self-pay | Admitting: Obstetrics and Gynecology

## 2022-11-07 MED ORDER — METOCLOPRAMIDE HCL 10 MG PO TABS: ORAL_TABLET | ORAL | 0 refills | Status: DC

## 2022-11-07 MED ORDER — VITAFOL ULTRA 29-0.6-0.4-200 MG PO CAPS: 1.0000 | ORAL_CAPSULE | Freq: Every day | ORAL | 12 refills | Status: DC

## 2022-11-07 NOTE — Progress Notes (Signed)
[facility] Visit Note  Misty Wood is a 27 y.o. G39P0101 female who presents for a postpartum visit. She is 6 weeks postpartum following a normal spontaneous vaginal delivery Placenta abruption.  I have fully reviewed the prenatal and intrapartum course. The delivery was at 32.4 gestational weeks.  Anesthesia: epidural. Postpartum course has been unremarkable. Baby is doing well. Baby is feeding by  bottle with Breast milk . Bleeding no bleeding. Bowel function is normal. Bladder function is normal. Patient is not sexually active. Contraception method is none. Postpartum depression screening: negative.   Upstream - 11/07/22 4854       Pregnancy Intention Screening   Does the patient want to become pregnant in the next year? No    Does the patient's partner want to become pregnant in the next year? No    Would the patient like to discuss contraceptive options today? No      Contraception Wrap Up   Current Method No Contraceptive Precautions            The pregnancy intention screening data noted above was reviewed. Potential methods of contraception were discussed. The patient elected to proceed with No data recorded.   Edinburgh Postnatal Depression Scale - 11/07/22 0926       Edinburgh Postnatal Depression Scale:  In the Past 7 Days   I have been able to laugh and see the funny side of things. 0    I have looked forward with enjoyment to things. 0    I have blamed myself unnecessarily when things went wrong. 0    I have been anxious or worried for no good reason. 0    I have felt scared or panicky for no good reason. 0    Things have been getting on top of me. 0    I have been so unhappy that I have had difficulty sleeping. 0    I have felt sad or miserable. 0    I have been so unhappy that I have been crying. 0    The thought of harming myself has occurred to me. 0    Edinburgh Postnatal Depression Scale Total 0             Health Maintenance Due  Topic Date  Due   COVID-19 Vaccine (1) Never done   HPV VACCINES (1 - 2-dose series) Never done       Review of Systems Pertinent items noted in HPI and remainder of comprehensive ROS otherwise negative.  Objective:  BP 107/75   Pulse 91   Ht 5\' 3"  (1.6 m)   Wt 176 lb (79.8 kg)   LMP  (LMP Unknown)   Breastfeeding No Comment: Pumps  BMI 31.18 kg/m    General:  alert, cooperative, and no distress   Breasts:  normal  Lungs: clear to auscultation bilaterally  Heart:  regular rate and rhythm  Abdomen: soft, non-tender; bowel sounds normal; no masses,  no organomegaly   Wound   GU exam:  not indicated       Assessment:    There are no diagnoses linked to this encounter.  Normal postpartum exam.   Plan:   Essential components of care per ACOG recommendations:  1.  Mood and well being: Patient with negative depression screening today. Reviewed local resources for support.  - Patient tobacco use? No.   - hx of drug use? No.    2. Infant care and feeding:  -Patient currently breastmilk feeding? Yes.  Reviewed importance of draining breast regularly to support lactation. Rx reglan provided -Social determinants of health (SDOH) reviewed in EPIC. No concerns  3. Sexuality, contraception and birth spacing - Patient does not want a pregnancy in the next year.  Desired family size is 2 children.  - Reviewed reproductive life planning. Reviewed contraceptive methods based on pt preferences and effectiveness.  Patient desired Abstinence today.   - Discussed birth spacing of 18 months  4. Sleep and fatigue -Encouraged family/partner/community support of 4 hrs of uninterrupted sleep to help with mood and fatigue  5. Physical Recovery  - Discussed patients delivery and complications. She describes her labor as good. - Patient had a Vaginal, no problems at delivery. Patient had no laceration. Perineal healing reviewed. Patient expressed understanding - Patient has urinary incontinence? No. -  Patient is safe to resume physical and sexual activity  6.  Health Maintenance - HM due items addressed Yes - Last pap smear normal in 2022. Pap smear not done at today's visit. Due in 2 years -Breast Cancer screening indicated? No.   7. Chronic Disease/Pregnancy Condition follow up: None Patient advised to discontinue procardia and to recheck BP at home. Parameters provided  - PCP follow up   [facility] [place]

## 2023-04-14 ENCOUNTER — Other Ambulatory Visit (INDEPENDENT_AMBULATORY_CARE_PROVIDER_SITE_OTHER): Payer: Medicaid Other

## 2023-04-14 ENCOUNTER — Ambulatory Visit: Payer: Medicaid Other

## 2023-04-14 VITALS — BP 112/76 | HR 80 | Wt 195.0 lb

## 2023-04-14 DIAGNOSIS — O3680X0 Pregnancy with inconclusive fetal viability, not applicable or unspecified: Secondary | ICD-10-CM | POA: Diagnosis not present

## 2023-04-14 DIAGNOSIS — Z3A01 Less than 8 weeks gestation of pregnancy: Secondary | ICD-10-CM | POA: Diagnosis not present

## 2023-04-14 NOTE — Progress Notes (Signed)
Patient noticed some red spotting this morning.  Ultrasound reveled gestationl sac measuring 6w 5 days. Consulted with Dr. Donavan Foil and patient to do HCG today and repeat in 48 hours. Patient to also schedule follow up ultrasound in two weeks.   Patient tearful- comfort measures extended and agrees with plan.  Armandina Stammer RN

## 2023-04-15 ENCOUNTER — Inpatient Hospital Stay (HOSPITAL_COMMUNITY)
Admission: AD | Admit: 2023-04-15 | Discharge: 2023-04-15 | Discharge status: Against Medical Advice | Payer: Medicaid Other | Attending: Obstetrics & Gynecology | Admitting: Obstetrics & Gynecology

## 2023-04-15 ENCOUNTER — Encounter (HOSPITAL_COMMUNITY): Payer: Self-pay | Admitting: Obstetrics & Gynecology

## 2023-04-15 ENCOUNTER — Inpatient Hospital Stay (HOSPITAL_COMMUNITY)
Admission: AD | Admit: 2023-04-15 | Discharge: 2023-04-16 | Disposition: A | Discharge status: Home / Self Care | Payer: Medicaid Other | Attending: Obstetrics & Gynecology | Admitting: Obstetrics & Gynecology

## 2023-04-15 ENCOUNTER — Inpatient Hospital Stay (HOSPITAL_COMMUNITY): Payer: Medicaid Other

## 2023-04-15 DIAGNOSIS — O034 Incomplete spontaneous abortion without complication: Secondary | ICD-10-CM

## 2023-04-15 DIAGNOSIS — Z3A10 10 weeks gestation of pregnancy: Secondary | ICD-10-CM | POA: Diagnosis not present

## 2023-04-15 DIAGNOSIS — O4690 Antepartum hemorrhage, unspecified, unspecified trimester: Secondary | ICD-10-CM | POA: Insufficient documentation

## 2023-04-15 DIAGNOSIS — O209 Hemorrhage in early pregnancy, unspecified: Secondary | ICD-10-CM | POA: Diagnosis not present

## 2023-04-15 DIAGNOSIS — Z3A00 Weeks of gestation of pregnancy not specified: Secondary | ICD-10-CM | POA: Insufficient documentation

## 2023-04-15 LAB — HCG, QUANTITATIVE, PREGNANCY: hCG, Beta Chain, Quant, S: 5249 m[IU]/mL — ABNORMAL HIGH (ref <5)

## 2023-04-15 LAB — CBC
HCT: 37.7 % (ref 36.0–46.0)
Hemoglobin: 12.3 g/dL (ref 12.0–15.0)
MCH: 29.6 pg (ref 26.0–34.0)
MCHC: 32.6 g/dL (ref 30.0–36.0)
MCV: 90.8 fL (ref 80.0–100.0)
Platelets: 175 10*3/uL (ref 150–400)
RBC: 4.15 MIL/uL (ref 3.87–5.11)
RDW: 13.8 % (ref 11.5–15.5)
WBC: 6.4 10*3/uL (ref 4.0–10.5)
nRBC: 0 % (ref 0.0–0.2)

## 2023-04-15 LAB — BETA HCG QUANT (REF LAB): hCG Quant: 4036 m[IU]/mL

## 2023-04-15 MED ORDER — ACETAMINOPHEN 325 MG PO TABS: 650.0000 mg | ORAL_TABLET | Freq: Four times a day (QID) | ORAL | Status: AC | PRN

## 2023-04-15 MED ORDER — MISOPROSTOL 200 MCG PO TABS: ORAL_TABLET | ORAL | 0 refills | Status: DC

## 2023-04-15 MED ORDER — IBUPROFEN 600 MG PO TABS: 600.0000 mg | ORAL_TABLET | Freq: Four times a day (QID) | ORAL | 0 refills | Status: DC | PRN

## 2023-04-15 NOTE — MAU Note (Signed)
.  Misty Wood is a 27 y.o. at [redacted]w[redacted]d here in MAU reporting: had some spotting yesterday went to [facility] office and told her dating was less than expected 6 w 4 days and no HR. Bleeding a little heavier today and some mild cramping.  LMP: 02/01/23 Onset of complaint: yesterday  Pain score: 3-4 Vitals:   04/15/23 1545  BP: 123/60  Pulse: 85  Resp: 18  Temp: 98.2 F (36.8 C)     FHT:n/a  Lab orders placed from triage:

## 2023-04-15 NOTE — MAU Note (Signed)
Pt. Left AMA without signing form @1815 .

## 2023-04-15 NOTE — MAU Provider Note (Signed)
History     CSN: [id number]  Arrival date and time: 04/15/23 1517   None     Chief Complaint  Patient presents with   Vaginal Bleeding   HPI Misty Wood. Deprey is a 27 y.o. G2P0101 at [redacted]w[redacted]d by LMP who presents to MAU for vaginal bleeding. She reports vaginal bleeding and cramping started over the weekend. Bleeding initially started out as spotting however got heavier after her ultrasound yesterday. She reports bleeding is dark red and is not as heavy today and only sees it with wiping. She is wearing a pad however has not had to change it today. She reports cramping in her vagina but none in her lower abdomen. She describes it as "an easy menstrual". She denies itching or odor. No urinary s/s. She reports she recently used medication to treat BV however did not finish it because she noticed the spotting started after using it. LMP 02/01/2023.   Of note, patient was seen at [facility] yesterday and ultrasound showed SIUGS. She had an HCG that was 4000. She is scheduled for f/u HCG tomorrow morning.   OB History     Gravida  2   Para  1   Term  0   Preterm  1   AB  0   Living  1      SAB  0   IAB  0   Ectopic  0   Multiple  0   Live Births  1        Obstetric Comments  G1- [name]         Past Medical History:  Diagnosis Date   Anxiety    Asthma    Depression     Past Surgical History:  Procedure Laterality Date   NO PAST SURGERIES      Family History  Problem Relation Age of Onset   Asthma Mother    Alcoholism Father    Kidney Stones Father    Stroke Maternal Grandfather    Hypertension Maternal Grandfather    Kidney failure Paternal Grandfather    Cancer Other    Birth defects Neg Hx    Diabetes Neg Hx    Heart disease Neg Hx     Social History   Tobacco Use   Smoking status: Never   Smokeless tobacco: Never  Vaping Use   Vaping Use: Former  Substance Use Topics   Alcohol use: Not Currently    Comment: occasionally   Drug use: Not  Currently    Types: Marijuana    Comment: prior to pregnancy    Allergies:  Allergies  Allergen Reactions   Penicillins Hives and Other (See Comments)    Has patient had a PCN reaction causing immediate rash, facial/tongue/throat swelling, SOB or lightheadedness with hypotension: Yes Has patient had a PCN reaction causing severe rash involving mucus membranes or skin necrosis: No Has patient had a PCN reaction that required hospitalization No Has patient had a PCN reaction occurring within the last 10 years: No If all of the above answers are "NO", then may proceed with Cephalosporin use.    Medications Prior to Admission  Medication Sig Dispense Refill Last Dose   acetaminophen (TYLENOL) 325 MG tablet Take 2 tablets (650 mg total) by mouth every 4 (four) hours as needed (for pain scale < 4). (Patient not taking: Reported on 04/14/2023) 60 tablet 0    albuterol (PROVENTIL HFA;VENTOLIN HFA) 108 (90 Base) MCG/ACT inhaler Inhale 2 puffs into the lungs every 6 (six)  hours as needed for wheezing or shortness of breath. 1 Inhaler 2    furosemide (LASIX) 20 MG tablet Take 1 tablet (20 mg total) by mouth daily for 2 days. 2 tablet 0    metoCLOPramide (REGLAN) 10 MG tablet 10 mg three times daily for 30 days, 10 mg twice daily for 4 days and 10 mg daily for 4 days (Patient not taking: Reported on 04/14/2023) 102 tablet 0    NIFEdipine (ADALAT CC) 60 MG 24 hr tablet Take 1 tablet (60 mg total) by mouth daily. (Patient not taking: Reported on 04/14/2023) 60 tablet 0    Prenat-Fe Poly-Methfol-FA-DHA (VITAFOL ULTRA) 29-0.6-0.4-200 MG CAPS Take 1 capsule by mouth daily. (Patient not taking: Reported on 04/14/2023) 30 capsule 12    Prenatal Vit-Fe Fumarate-FA (PRENATAL MULTIVITAMIN) TABS tablet Take 1 tablet by mouth daily at 12 noon. (Patient not taking: Reported on 04/14/2023)      Review of Systems  Genitourinary:  Positive for vaginal bleeding and vaginal pain.  All other systems reviewed and are  negative.  Physical Exam   Blood pressure 123/60, pulse 85, temperature 98.2 F (36.8 C), resp. rate 18, last menstrual period 02/01/2023, not currently breastfeeding.  Physical Exam Vitals and nursing note reviewed. Exam conducted with a chaperone present.  Constitutional:      General: She is not in acute distress. Eyes:     Extraocular Movements: Extraocular movements intact.     Pupils: Pupils are equal, round, and reactive to light.  Cardiovascular:     Rate and Rhythm: Normal rate.  Pulmonary:     Effort: Pulmonary effort is normal. No respiratory distress.  Abdominal:     Palpations: Abdomen is soft.     Tenderness: There is no abdominal tenderness.  Genitourinary:    Comments: Normal external female genitalia, vaginal walls pink and well-rugated, small amount of dark red mucoid bleeding, cervix visually closed without lesions/masses Musculoskeletal:        General: Normal range of motion.     Cervical back: Normal range of motion.  Skin:    General: Skin is warm and dry.  Neurological:     General: No focal deficit present.     Mental Status: She is alert and oriented to person, place, and time.  Psychiatric:        Mood and Affect: Mood normal.        Behavior: Behavior normal.    Results for orders placed or performed during the hospital encounter of 04/15/23 (from the past 24 hour(s))  CBC     Status: None   Collection Time: 04/15/23  4:42 PM  Result Value Ref Range   WBC 6.4 4.0 - 10.5 K/uL   RBC 4.15 3.87 - 5.11 MIL/uL   Hemoglobin 12.3 12.0 - 15.0 g/dL   HCT 16.1 09.6 - 04.5 %   MCV 90.8 80.0 - 100.0 fL   MCH 29.6 26.0 - 34.0 pg   MCHC 32.6 30.0 - 36.0 g/dL   RDW 40.9 81.1 - 91.4 %   Platelets 175 150 - 400 K/uL   nRBC 0.0 0.0 - 0.2 %    MAU Course  Procedures  MDM CBC, HCG  Assessment and Plan  [redacted] weeks gestation of pregnancy Vaginal bleeding  - Patient left without notifying staff  Brand Males, CNM 04/15/2023, 6:32 PM

## 2023-04-15 NOTE — Progress Notes (Signed)
Pt returned from prior visit.  Ultrasound reviewed see previous note  per Camelia Eng, CNM  Myna Hidalgo, DO Attending Obstetrician & Gynecologist, [facility] for [facility], [facility]

## 2023-04-15 NOTE — MAU Note (Signed)
..  Misty Wood is a 28 y.o. at [redacted]w[redacted]d here in MAU reporting: returned, had to leave earlier for childcare. Is back now with childcare. Reports the cramping and vaginal bleeding have gotten worse.  Is now having large clots, and has had to change her pad once an hour.   Pain score: 9/10 Vitals:   04/15/23 2159  BP: (!) 118/59  Pulse: 94  Resp: 17  Temp: 98.2 F (36.8 C)  SpO2: 100%

## 2023-04-16 ENCOUNTER — Other Ambulatory Visit: Payer: Medicaid Other

## 2023-04-22 ENCOUNTER — Ambulatory Visit: Payer: Medicaid Other | Admitting: Obstetrics and Gynecology

## 2023-06-02 ENCOUNTER — Other Ambulatory Visit (HOSPITAL_COMMUNITY): Payer: Medicaid Other

## 2023-06-02 ENCOUNTER — Ambulatory Visit (HOSPITAL_COMMUNITY)
Admission: EM | Admit: 2023-06-02 | Discharge: 2023-06-02 | Disposition: A | Discharge status: Home / Self Care | Payer: Medicaid Other | Attending: Family Medicine | Admitting: Family Medicine

## 2023-06-02 ENCOUNTER — Ambulatory Visit (INDEPENDENT_AMBULATORY_CARE_PROVIDER_SITE_OTHER): Payer: Medicaid Other

## 2023-06-02 ENCOUNTER — Encounter (HOSPITAL_COMMUNITY): Payer: Self-pay | Admitting: *Deleted

## 2023-06-02 DIAGNOSIS — M79672 Pain in left foot: Secondary | ICD-10-CM | POA: Diagnosis not present

## 2023-06-02 MED ORDER — IBUPROFEN 800 MG PO TABS: 800.0000 mg | ORAL_TABLET | Freq: Three times a day (TID) | ORAL | 0 refills | Status: AC | PRN

## 2023-06-02 NOTE — Discharge Instructions (Signed)
Your x-ray did not show any broken bones.  Take ibuprofen 800 mg--1 tab every 8 hours as needed for pain.  Ice and elevate your foot in the next day or 2.

## 2023-06-02 NOTE — ED Triage Notes (Signed)
Pt reports she off of the frame of her bed and fell onto her left foot earlier today.Marland Kitchen

## 2023-06-03 ENCOUNTER — Ambulatory Visit (HOSPITAL_COMMUNITY): Payer: Medicaid Other

## 2023-06-03 NOTE — ED Provider Notes (Addendum)
[facility]    CSN: [id number] Arrival date & time: 06/02/23  1958      History   Chief Complaint No chief complaint on file.   HPI Misty Wood is a 27 y.o. female.   HPI Here with left foot pain.  She was standing on the edge of her bed frame trying to kill a fly when she slipped off and fell onto her left foot and inverted the foot.  Last menstrual cycle was August 5.   Past Medical History:  Diagnosis Date   Anxiety    Asthma    Depression     Patient Active Problem List   Diagnosis Date Noted   Postpartum anemia 09/23/2022   Preterm delivery 09/18/2022   Vaginal delivery 09/18/2022   Placental abruption 09/18/2022   Hypertension in pregnancy, preeclampsia, severe, delivered 09/16/2022   IUGR (intrauterine growth restriction) affecting care of mother 08/21/2022   Supervision of other normal pregnancy, antepartum 06/23/2022   Chronic bilateral thoracic back pain 08/22/2021   Severe recurrent major depression without psychotic features (HCC) 07/15/2018   Asthma 01/25/2014    Past Surgical History:  Procedure Laterality Date   NO PAST SURGERIES      OB History     Gravida  2   Para  1   Term  0   Preterm  1   AB  0   Living  1      SAB  0   IAB  0   Ectopic  0   Multiple  0   Live Births  1        Obstetric Comments  G1- [name]          Home Medications    Prior to Admission medications   Medication Sig Start Date End Date Taking? Authorizing Provider  ibuprofen (ADVIL) 800 MG tablet Take 1 tablet (800 mg total) by mouth every 8 (eight) hours as needed (pain). 06/02/23  Yes Zenia Resides, MD  acetaminophen (TYLENOL) 325 MG tablet Take 2 tablets (650 mg total) by mouth every 6 (six) hours as needed. 04/15/23   Myna Hidalgo, DO  albuterol (PROVENTIL HFA;VENTOLIN HFA) 108 (90 Base) MCG/ACT inhaler Inhale 2 puffs into the lungs every 6 (six) hours as needed for wheezing or shortness of breath. 11/14/18    Caccavale, Sophia, PA-C    Family History Family History  Problem Relation Age of Onset   Asthma Mother    Alcoholism Father    Kidney Stones Father    Stroke Maternal Grandfather    Hypertension Maternal Grandfather    Kidney failure Paternal Grandfather    Cancer Other    Birth defects Neg Hx    Diabetes Neg Hx    Heart disease Neg Hx     Social History Social History   Tobacco Use   Smoking status: Never   Smokeless tobacco: Never  Vaping Use   Vaping status: Former  Substance Use Topics   Alcohol use: Not Currently    Comment: occasionally   Drug use: Not Currently    Types: Marijuana    Comment: prior to pregnancy     Allergies   Penicillins   Review of Systems Review of Systems   Physical Exam Triage Vital Signs ED Triage Vitals [06/02/23 2010]  Encounter Vitals Group     BP (!) 114/53     Systolic BP Percentile      Diastolic BP Percentile      Pulse Rate 94  Resp 18     Temp 98 F (36.7 C)     Temp Source Oral     SpO2 97 %     Weight      Height      Head Circumference      Peak Flow      Pain Score 10     Pain Loc      Pain Education      Exclude from Growth Chart    No data found.  Updated Vital Signs BP (!) 114/53 (BP Location: Right Arm)   Pulse 94   Temp 98 F (36.7 C) (Oral)   Resp 18   LMP 05/11/2023 (Approximate)   SpO2 97%   Breastfeeding Unknown   Visual Acuity Right Eye Distance:   Left Eye Distance:   Bilateral Distance:    Right Eye Near:   Left Eye Near:    Bilateral Near:     Physical Exam Vitals reviewed.  Constitutional:      General: She is not in acute distress.    Appearance: She is not toxic-appearing.  Musculoskeletal:     Comments: There is tenderness along her lateral left foot and inferior to the lateral malleolus.  Skin:    Coloration: Skin is not pale.  Neurological:     Mental Status: She is alert.      UC Treatments / Results  Labs (all labs ordered are listed, but only  abnormal results are displayed) Labs Reviewed - No data to display  EKG   Radiology DG Foot Complete Left  Result Date: 06/02/2023 CLINICAL DATA:  Injury. EXAM: LEFT FOOT - COMPLETE 3+ VIEW COMPARISON:  None Available. FINDINGS: There is no evidence of fracture or dislocation. There is no evidence of arthropathy or other focal bone abnormality. Soft tissues are unremarkable. IMPRESSION: Negative. Electronically Signed   By: Thornell Sartorius M.D.   On: 06/02/2023 20:37    Procedures Procedures (including critical care time)  Medications Ordered in UC Medications - No data to display  Initial Impression / Assessment and Plan / UC Course  I have reviewed the triage vital signs and the nursing notes.  Pertinent labs & imaging results that were available during my care of the patient were reviewed by me and considered in my medical decision making (see chart for details).      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  No one has repeated her labs since.  Prior to that pregnancy her GFR was normal.  X-ray is negative.  Ace wrap is supplied and ibuprofen is sent in to take as needed for pain Final Clinical Impressions(s) / UC Diagnoses   Final diagnoses:  Left foot pain     Discharge Instructions      Your x-ray did not show any broken bones.  Take ibuprofen 800 mg--1 tab every 8 hours as needed for pain.  Ice and elevate your foot in the next day or 2.    ED Prescriptions     Medication Sig Dispense Auth. Provider   ibuprofen (ADVIL) 800 MG tablet Take 1 tablet (800 mg total) by mouth every 8 (eight) hours as needed (pain). 21 tablet Zenya Hickam, Janace Aris, MD      PDMP not reviewed this encounter.   Zenia Resides, MD 06/03/23 1610    Zenia Resides, MD 06/03/23 [phone]

## 2023-07-31 ENCOUNTER — Encounter (HOSPITAL_COMMUNITY): Payer: Self-pay

## 2023-07-31 ENCOUNTER — Ambulatory Visit (HOSPITAL_COMMUNITY)
Admission: RE | Admit: 2023-07-31 | Discharge: 2023-07-31 | Disposition: A | Discharge status: Home / Self Care | Payer: Medicaid Other | Source: Ambulatory Visit | Attending: Emergency Medicine

## 2023-07-31 VITALS — BP 102/70 | HR 77 | Temp 98.5°F | Resp 18

## 2023-07-31 DIAGNOSIS — N898 Other specified noninflammatory disorders of vagina: Secondary | ICD-10-CM | POA: Insufficient documentation

## 2023-07-31 DIAGNOSIS — Z113 Encounter for screening for infections with a predominantly sexual mode of transmission: Secondary | ICD-10-CM | POA: Diagnosis present

## 2023-07-31 LAB — HIV ANTIBODY (ROUTINE TESTING W REFLEX): HIV Screen 4th Generation wRfx: NONREACTIVE

## 2023-07-31 LAB — POCT URINE PREGNANCY: Preg Test, Ur: NEGATIVE

## 2023-07-31 NOTE — ED Triage Notes (Addendum)
Pt states she has had an increase in vaginal discharge that is watery and has a foul smell X 4-5 days. She states she would like STI testing.   She states her cycle is late, pregnancy test ordered.

## 2023-07-31 NOTE — Discharge Instructions (Signed)
Today you have been screened for bacterial vaginosis, yeast, gonorrhea, chlamydia and trichomoniasis via your cytology swab.  We have also obtained blood work to screen for HIV and syphilis.  Abstain from intercourse until all results have been received and notify any sexual partners if you test positive for any sexually transmitted infections.   Return to clinic for any new or urgent symptoms.

## 2023-07-31 NOTE — ED Provider Notes (Signed)
[facility]    CSN: [id number] Arrival date & time: 07/31/23  1425      History   Chief Complaint Chief Complaint  Patient presents with   Exposure to STD    I would like to get tested due to increase and type of discharge - Entered by patient   Vaginal Discharge    HPI Misty Wood is a 27 y.o. female.   Patient presents to clinic complaining of an abnormal vaginal discharge that initially did not have an odor, but is now developed an odor over the past few days.  She denies any dysuria, vaginal sores, lesions, flank pain, fevers, nausea or vomiting.  Reports she has had chlamydia in the past and knows how it presents, but this is different.  She would like screening for HIV and syphilis as well.  The history is provided by the patient and medical records.  Exposure to STD  Vaginal Discharge Associated symptoms: no dysuria     Past Medical History:  Diagnosis Date   Anxiety    Asthma    Depression     Patient Active Problem List   Diagnosis Date Noted   Postpartum anemia 09/23/2022   Preterm delivery 09/18/2022   Vaginal delivery 09/18/2022   Placental abruption 09/18/2022   Hypertension in pregnancy, preeclampsia, severe, delivered 09/16/2022   IUGR (intrauterine growth restriction) affecting care of mother 08/21/2022   Supervision of other normal pregnancy, antepartum 06/23/2022   Chronic bilateral thoracic back pain 08/22/2021   Severe recurrent major depression without psychotic features (HCC) 07/15/2018   Asthma 01/25/2014    Past Surgical History:  Procedure Laterality Date   NO PAST SURGERIES      OB History     Gravida  2   Para  1   Term  0   Preterm  1   AB  0   Living  1      SAB  0   IAB  0   Ectopic  0   Multiple  0   Live Births  1        Obstetric Comments  G1- [name]          Home Medications    Prior to Admission medications   Medication Sig Start Date End Date Taking? Authorizing  Provider  acetaminophen (TYLENOL) 325 MG tablet Take 2 tablets (650 mg total) by mouth every 6 (six) hours as needed. 04/15/23   Myna Hidalgo, DO  albuterol (PROVENTIL HFA;VENTOLIN HFA) 108 (90 Base) MCG/ACT inhaler Inhale 2 puffs into the lungs every 6 (six) hours as needed for wheezing or shortness of breath. 11/14/18   Caccavale, Sophia, PA-C  ibuprofen (ADVIL) 800 MG tablet Take 1 tablet (800 mg total) by mouth every 8 (eight) hours as needed (pain). 06/02/23   Zenia Resides, MD    Family History Family History  Problem Relation Age of Onset   Asthma Mother    Alcoholism Father    Kidney Stones Father    Stroke Maternal Grandfather    Hypertension Maternal Grandfather    Kidney failure Paternal Grandfather    Cancer Other    Birth defects Neg Hx    Diabetes Neg Hx    Heart disease Neg Hx     Social History Social History   Tobacco Use   Smoking status: Never   Smokeless tobacco: Never  Vaping Use   Vaping status: Former  Substance Use Topics   Alcohol use: Yes  Comment: occasionally   Drug use: Yes    Types: Marijuana     Allergies   Penicillins   Review of Systems Review of Systems  Genitourinary:  Positive for vaginal discharge. Negative for dysuria, flank pain, genital sores, hematuria and urgency.     Physical Exam Triage Vital Signs ED Triage Vitals  Encounter Vitals Group     BP 07/31/23 1445 102/70     Systolic BP Percentile --      Diastolic BP Percentile --      Pulse Rate 07/31/23 1445 77     Resp 07/31/23 1445 18     Temp 07/31/23 1445 98.5 F (36.9 C)     Temp Source 07/31/23 1445 Oral     SpO2 07/31/23 1445 97 %     Weight --      Height --      Head Circumference --      Peak Flow --      Pain Score 07/31/23 1444 0     Pain Loc --      Pain Education --      Exclude from Growth Chart --    No data found.  Updated Vital Signs BP 102/70 (BP Location: Right Arm)   Pulse 77   Temp 98.5 F (36.9 C) (Oral)   Resp 18   LMP  07/20/2023 (Exact Date)   SpO2 97%   Breastfeeding No   Visual Acuity Right Eye Distance:   Left Eye Distance:   Bilateral Distance:    Right Eye Near:   Left Eye Near:    Bilateral Near:     Physical Exam Vitals and nursing note reviewed.  Constitutional:      Appearance: Normal appearance.  HENT:     Head: Normocephalic and atraumatic.     Right Ear: External ear normal.     Left Ear: External ear normal.     Nose: Nose normal.     Mouth/Throat:     Mouth: Mucous membranes are moist.  Eyes:     Conjunctiva/sclera: Conjunctivae normal.  Cardiovascular:     Rate and Rhythm: Normal rate.  Pulmonary:     Effort: Pulmonary effort is normal. No respiratory distress.  Musculoskeletal:        General: Normal range of motion.  Neurological:     General: No focal deficit present.     Mental Status: She is alert and oriented to person, place, and time.  Psychiatric:        Mood and Affect: Mood normal.        Behavior: Behavior normal.      UC Treatments / Results  Labs (all labs ordered are listed, but only abnormal results are displayed) Labs Reviewed  RPR  HIV ANTIBODY (ROUTINE TESTING W REFLEX)  POCT URINE PREGNANCY  CERVICOVAGINAL ANCILLARY ONLY    EKG   Radiology No results found.  Procedures Procedures (including critical care time)  Medications Ordered in UC Medications - No data to display  Initial Impression / Assessment and Plan / UC Course  I have reviewed the triage vital signs and the nursing notes.  Pertinent labs & imaging results that were available during my care of the patient were reviewed by me and considered in my medical decision making (see chart for details).  Vitals and triage reviewed, patient is hemodynamically stable.  Changes to vaginal discharge, patient is sexually active.  Cytology swab, HIV and syphilis screening obtained.  Staff will contact if treatment is indicated  based on results.  Urine pregnancy is negative, no  dysuria, frequency or urinary symptoms, UA deferred.  Plan of care, follow-up care return precautions given, no questions at this time.     Final Clinical Impressions(s) / UC Diagnoses   Final diagnoses:  Vaginal discharge  Screening examination for sexually transmitted disease     Discharge Instructions      Today you have been screened for bacterial vaginosis, yeast, gonorrhea, chlamydia and trichomoniasis via your cytology swab.  We have also obtained blood work to screen for HIV and syphilis.  Abstain from intercourse until all results have been received and notify any sexual partners if you test positive for any sexually transmitted infections.   Return to clinic for any new or urgent symptoms.    ED Prescriptions   None    PDMP not reviewed this encounter.   Marcia Hartwell, [place], [place] 07/31/23 [phone]

## 2023-08-01 LAB — RPR: RPR Ser Ql: NONREACTIVE

## 2023-08-03 LAB — CERVICOVAGINAL ANCILLARY ONLY
Bacterial Vaginitis (gardnerella): POSITIVE — AB
Candida Glabrata: NEGATIVE
Candida Vaginitis: NEGATIVE
Chlamydia: NEGATIVE
Comment: NEGATIVE
Comment: NEGATIVE
Comment: NEGATIVE
Comment: NEGATIVE
Comment: NEGATIVE
Comment: NORMAL
Neisseria Gonorrhea: NEGATIVE
Trichomonas: NEGATIVE

## 2023-08-04 ENCOUNTER — Telehealth (HOSPITAL_COMMUNITY): Payer: Self-pay | Admitting: Emergency Medicine

## 2023-08-04 MED ORDER — METRONIDAZOLE 500 MG PO TABS: 500.0000 mg | ORAL_TABLET | Freq: Two times a day (BID) | ORAL | 0 refills | Status: AC

## 2023-08-04 NOTE — Telephone Encounter (Signed)
Metronidazole for positive BV

## 2023-10-02 ENCOUNTER — Ambulatory Visit (HOSPITAL_COMMUNITY): Payer: Self-pay
